# Patient Record
Sex: Female | Born: 1995 | Race: White | Hispanic: No | Marital: Single | Smoking: Never smoker
Health system: Southern US, Community
[De-identification: ages and names within clinical notes are randomized; demographics above are authoritative.]

## PROBLEM LIST (undated history)

## (undated) DIAGNOSIS — R109 Unspecified abdominal pain: Secondary | ICD-10-CM

## (undated) DIAGNOSIS — O09299 Supervision of pregnancy with other poor reproductive or obstetric history, unspecified trimester: Secondary | ICD-10-CM

## (undated) HISTORY — PX: TONSILLECTOMY: SUR1361

## (undated) HISTORY — DX: Unspecified abdominal pain: R10.9

## (undated) HISTORY — DX: Supervision of pregnancy with other poor reproductive or obstetric history, unspecified trimester: O09.299

---
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
## 2017-12-16 DIAGNOSIS — O26893 Other specified pregnancy related conditions, third trimester: Secondary | ICD-10-CM | POA: Diagnosis not present

## 2017-12-16 DIAGNOSIS — Z3A33 33 weeks gestation of pregnancy: Secondary | ICD-10-CM | POA: Diagnosis not present

## 2017-12-16 DIAGNOSIS — R109 Unspecified abdominal pain: Secondary | ICD-10-CM | POA: Diagnosis not present

## 2017-12-28 DIAGNOSIS — Z3685 Encounter for antenatal screening for Streptococcus B: Secondary | ICD-10-CM | POA: Diagnosis not present

## 2018-01-30 DIAGNOSIS — O480 Post-term pregnancy: Secondary | ICD-10-CM | POA: Diagnosis not present

## 2018-01-31 DIAGNOSIS — O99013 Anemia complicating pregnancy, third trimester: Secondary | ICD-10-CM | POA: Diagnosis not present

## 2018-01-31 DIAGNOSIS — Z3A40 40 weeks gestation of pregnancy: Secondary | ICD-10-CM | POA: Diagnosis not present

## 2018-01-31 DIAGNOSIS — O480 Post-term pregnancy: Secondary | ICD-10-CM | POA: Diagnosis not present

## 2018-06-06 NOTE — L&D Delivery Note (Signed)
Delivery Note At 7:17 PM a viable female was delivered via Vaginal, Spontaneous (Presentation: Vertex; LOA).  APGAR: 9, 9; weight see delivery summary.   Placenta status: spontaneous, intact .  Cord: 3 vessels with the following complications: nuchal x 2 reduced as body delivered.   Anesthesia: Epidural Episiotomy: None Lacerations: None Suture Repair: N/A Est. Blood Loss (mL): 400  Mom to postpartum.  Baby to Couplet care / Skin to Skin.  [place], DO 01/18/2019, 8:27 PM

## 2018-06-22 ENCOUNTER — Encounter: Payer: Self-pay | Admitting: Adult Health

## 2018-06-29 ENCOUNTER — Encounter: Payer: Self-pay | Admitting: Adult Health

## 2018-06-29 ENCOUNTER — Ambulatory Visit: Payer: Medicaid Other | Admitting: Adult Health

## 2018-06-29 VITALS — BP 137/83 | HR 103 | Ht 61.0 in | Wt 198.0 lb

## 2018-06-29 DIAGNOSIS — Z3490 Encounter for supervision of normal pregnancy, unspecified, unspecified trimester: Secondary | ICD-10-CM | POA: Insufficient documentation

## 2018-06-29 DIAGNOSIS — O09891 Supervision of other high risk pregnancies, first trimester: Secondary | ICD-10-CM

## 2018-06-29 DIAGNOSIS — O09899 Supervision of other high risk pregnancies, unspecified trimester: Secondary | ICD-10-CM | POA: Insufficient documentation

## 2018-06-29 DIAGNOSIS — Z3201 Encounter for pregnancy test, result positive: Secondary | ICD-10-CM | POA: Diagnosis not present

## 2018-06-29 DIAGNOSIS — O3680X0 Pregnancy with inconclusive fetal viability, not applicable or unspecified: Secondary | ICD-10-CM

## 2018-06-29 LAB — POCT URINE PREGNANCY: Preg Test, Ur: POSITIVE — AB

## 2018-06-29 MED ORDER — PRENATAL VITAMINS 0.8 MG PO TABS: 1.0000 | ORAL_TABLET | Freq: Every day | ORAL | 12 refills | Status: DC

## 2018-06-29 NOTE — Patient Instructions (Signed)
Second Trimester of Pregnancy  The second trimester is from week 14 through week 27 (month 4 through 6). This is often the time in pregnancy that you feel your best. Often times, morning sickness has lessened or quit. You may have more energy, and you may get hungry more often. Your unborn baby is growing rapidly. At the end of the sixth month, he or she is about 9 inches long and weighs about 1 pounds. You will likely feel the baby move between 18 and 20 weeks of pregnancy. Follow these instructions at home: Medicines  Take over-the-counter and prescription medicines only as told by your doctor. Some medicines are safe and some medicines are not safe during pregnancy.  Take a prenatal vitamin that contains at least 600 micrograms (mcg) of folic acid.  If you have trouble pooping (constipation), take medicine that will make your stool soft (stool softener) if your doctor approves. Eating and drinking   Eat regular, healthy meals.  Avoid raw meat and uncooked cheese.  If you get low calcium from the food you eat, talk to your doctor about taking a daily calcium supplement.  Avoid foods that are high in fat and sugars, such as fried and sweet foods.  If you feel sick to your stomach (nauseous) or throw up (vomit): ? Eat 4 or 5 small meals a day instead of 3 large meals. ? Try eating a few soda crackers. ? Drink liquids between meals instead of during meals.  To prevent constipation: ? Eat foods that are high in fiber, like fresh fruits and vegetables, whole grains, and beans. ? Drink enough fluids to keep your pee (urine) clear or pale yellow. Activity  Exercise only as told by your doctor. Stop exercising if you start to have cramps.  Do not exercise if it is too hot, too humid, or if you are in a place of great height (high altitude).  Avoid heavy lifting.  Wear low-heeled shoes. Sit and stand up straight.  You can continue to have sex unless your doctor tells you not to.  Relieving pain and discomfort  Wear a good support bra if your breasts are tender.  Take warm water baths (sitz baths) to soothe pain or discomfort caused by hemorrhoids. Use hemorrhoid cream if your doctor approves.  Rest with your legs raised if you have leg cramps or low back pain.  If you develop puffy, bulging veins (varicose veins) in your legs: ? Wear support hose or compression stockings as told by your doctor. ? Raise (elevate) your feet for 15 minutes, 3-4 times a day. ? Limit salt in your food. Prenatal care  Write down your questions. Take them to your prenatal visits.  Keep all your prenatal visits as told by your doctor. This is important. Safety  Wear your seat belt when driving.  Make a list of emergency phone numbers, including numbers for family, friends, the hospital, and police and fire departments. General instructions  Ask your doctor about the right foods to eat or for help finding a counselor, if you need these services.  Ask your doctor about local prenatal classes. Begin classes before month 6 of your pregnancy.  Do not use hot tubs, steam rooms, or saunas.  Do not douche or use tampons or scented sanitary pads.  Do not cross your legs for long periods of time.  Visit your dentist if you have not done so. Use a soft toothbrush to brush your teeth. Floss gently.  Avoid all smoking, herbs,   and alcohol. Avoid drugs that are not approved by your doctor.  Do not use any products that contain nicotine or tobacco, such as cigarettes and e-cigarettes. If you need help quitting, ask your doctor.  Avoid cat litter boxes and soil used by cats. These carry germs that can cause birth defects in the baby and can cause a loss of your baby (miscarriage) or stillbirth. Contact a doctor if:  You have mild cramps or pressure in your lower belly.  You have pain when you pee (urinate).  You have bad smelling fluid coming from your vagina.  You continue to feel  sick to your stomach (nauseous), throw up (vomit), or have watery poop (diarrhea).  You have a nagging pain in your belly area.  You feel dizzy. Get help right away if:  You have a fever.  You are leaking fluid from your vagina.  You have spotting or bleeding from your vagina.  You have severe belly cramping or pain.  You lose or gain weight rapidly.  You have trouble catching your breath and have chest pain.  You notice sudden or extreme puffiness (swelling) of your face, hands, ankles, feet, or legs.  You have not felt the baby move in over an hour.  You have severe headaches that do not go away when you take medicine.  You have trouble seeing. Summary  The second trimester is from week 14 through week 27 (months 4 through 6). This is often the time in pregnancy that you feel your best.  To take care of yourself and your unborn baby, you will need to eat healthy meals, take medicines only if your doctor tells you to do so, and do activities that are safe for you and your baby.  Call your doctor if you get sick or if you notice anything unusual about your pregnancy. Also, call your doctor if you need help with the right food to eat, or if you want to know what activities are safe for you. This information is not intended to replace advice given to you by your health care provider. Make sure you discuss any questions you have with your health care provider. Document Released: 08/17/2009 Document Revised: 06/28/2016 Document Reviewed: 06/28/2016 Elsevier Interactive Patient Education  2019 [employer] First Trimester of Pregnancy The first trimester of pregnancy is from week 1 until the end of week 13 (months 1 through 3). A week after a sperm fertilizes an egg, the egg will implant on the wall of the uterus. This embryo will begin to develop into a baby. Genes from you and your partner will form the baby. The female genes will determine whether the baby will be a boy or a girl.  At 6-8 weeks, the eyes and face will be formed, and the heartbeat can be seen on ultrasound. At the end of 12 weeks, all the baby's organs will be formed. Now that you are pregnant, you will want to do everything you can to have a healthy baby. Two of the most important things are to get good prenatal care and to follow your health care provider's instructions. Prenatal care is all the medical care you receive before the baby's birth. This care will help prevent, find, and treat any problems during the pregnancy and childbirth. Body changes during your first trimester Your body goes through many changes during pregnancy. The changes vary from woman to woman.  You may gain or lose a couple of pounds at first.  You may feel sick   to your stomach (nauseous) and you may throw up (vomit). If the vomiting is uncontrollable, call your health care provider.  You may tire easily.  You may develop headaches that can be relieved by medicines. All medicines should be approved by your health care provider.  You may urinate more often. Painful urination may mean you have a bladder infection.  You may develop heartburn as a result of your pregnancy.  You may develop constipation because certain hormones are causing the muscles that push stool through your intestines to slow down.  You may develop hemorrhoids or swollen veins (varicose veins).  Your breasts may begin to grow larger and become tender. Your nipples may stick out more, and the tissue that surrounds them (areola) may become darker.  Your gums may bleed and may be sensitive to brushing and flossing.  Dark spots or blotches (chloasma, mask of pregnancy) may develop on your face. This will likely fade after the baby is born.  Your menstrual periods will stop.  You may have a loss of appetite.  You may develop cravings for certain kinds of food.  You may have changes in your emotions from day to day, such as being excited to be pregnant or  being concerned that something may go wrong with the pregnancy and baby.  You may have more vivid and strange dreams.  You may have changes in your hair. These can include thickening of your hair, rapid growth, and changes in texture. Some women also have hair loss during or after pregnancy, or hair that feels dry or thin. Your hair will most likely return to normal after your baby is born. What to expect at prenatal visits During a routine prenatal visit:  You will be weighed to make sure you and the baby are growing normally.  Your blood pressure will be taken.  Your abdomen will be measured to track your baby's growth.  The fetal heartbeat will be listened to between weeks 10 and 14 of your pregnancy.  Test results from any previous visits will be discussed. Your health care provider may ask you:  How you are feeling.  If you are feeling the baby move.  If you have had any abnormal symptoms, such as leaking fluid, bleeding, severe headaches, or abdominal cramping.  If you are using any tobacco products, including cigarettes, chewing tobacco, and electronic cigarettes.  If you have any questions. Other tests that may be performed during your first trimester include:  Blood tests to find your blood type and to check for the presence of any previous infections. The tests will also be used to check for low iron levels (anemia) and protein on red blood cells (Rh antibodies). Depending on your risk factors, or if you previously had diabetes during pregnancy, you may have tests to check for high blood sugar that affects pregnant women (gestational diabetes).  Urine tests to check for infections, diabetes, or protein in the urine.  An ultrasound to confirm the proper growth and development of the baby.  Fetal screens for spinal cord problems (spina bifida) and Down syndrome.  HIV (human immunodeficiency virus) testing. Routine prenatal testing includes screening for HIV, unless you  choose not to have this test.  You may need other tests to make sure you and the baby are doing well. Follow these instructions at home: Medicines  Follow your health care provider's instructions regarding medicine use. Specific medicines may be either safe or unsafe to take during pregnancy.  Take a prenatal vitamin   that contains at least 600 micrograms (mcg) of folic acid.  If you develop constipation, try taking a stool softener if your health care provider approves. Eating and drinking   Eat a balanced diet that includes fresh fruits and vegetables, whole grains, good sources of protein such as meat, eggs, or tofu, and low-fat dairy. Your health care provider will help you determine the amount of weight gain that is right for you.  Avoid raw meat and uncooked cheese. These carry germs that can cause birth defects in the baby.  Eating four or five small meals rather than three large meals a day may help relieve nausea and vomiting. If you start to feel nauseous, eating a few soda crackers can be helpful. Drinking liquids between meals, instead of during meals, also seems to help ease nausea and vomiting.  Limit foods that are high in fat and processed sugars, such as fried and sweet foods.  To prevent constipation: ? Eat foods that are high in fiber, such as fresh fruits and vegetables, whole grains, and beans. ? Drink enough fluid to keep your urine clear or pale yellow. Activity  Exercise only as directed by your health care provider. Most women can continue their usual exercise routine during pregnancy. Try to exercise for 30 minutes at least 5 days a week. Exercising will help you: ? Control your weight. ? Stay in shape. ? Be prepared for labor and delivery.  Experiencing pain or cramping in the lower abdomen or lower back is a good sign that you should stop exercising. Check with your health care provider before continuing with normal exercises.  Try to avoid standing for  long periods of time. Move your legs often if you must stand in one place for a long time.  Avoid heavy lifting.  Wear low-heeled shoes and practice good posture.  You may continue to have sex unless your health care provider tells you not to. Relieving pain and discomfort  Wear a good support bra to relieve breast tenderness.  Take warm sitz baths to soothe any pain or discomfort caused by hemorrhoids. Use hemorrhoid cream if your health care provider approves.  Rest with your legs elevated if you have leg cramps or low back pain.  If you develop varicose veins in your legs, wear support hose. Elevate your feet for 15 minutes, 3-4 times a day. Limit salt in your diet. Prenatal care  Schedule your prenatal visits by the twelfth week of pregnancy. They are usually scheduled monthly at first, then more often in the last 2 months before delivery.  Write down your questions. Take them to your prenatal visits.  Keep all your prenatal visits as told by your health care provider. This is important. Safety  Wear your seat belt at all times when driving.  Make a list of emergency phone numbers, including numbers for family, friends, the hospital, and police and fire departments. General instructions  Ask your health care provider for a referral to a local prenatal education class. Begin classes no later than the beginning of month 6 of your pregnancy.  Ask for help if you have counseling or nutritional needs during pregnancy. Your health care provider can offer advice or refer you to specialists for help with various needs.  Do not use hot tubs, steam rooms, or saunas.  Do not douche or use tampons or scented sanitary pads.  Do not cross your legs for long periods of time.  Avoid cat litter boxes and soil used by cats.   These carry germs that can cause birth defects in the baby and possibly loss of the fetus by miscarriage or stillbirth.  Avoid all smoking, herbs, alcohol, and  medicines not prescribed by your health care provider. Chemicals in these products affect the formation and growth of the baby.  Do not use any products that contain nicotine or tobacco, such as cigarettes and e-cigarettes. If you need help quitting, ask your health care provider. You may receive counseling support and other resources to help you quit.  Schedule a dentist appointment. At home, brush your teeth with a soft toothbrush and be gentle when you floss. Contact a health care provider if:  You have dizziness.  You have mild pelvic cramps, pelvic pressure, or nagging pain in the abdominal area.  You have persistent nausea, vomiting, or diarrhea.  You have a bad smelling vaginal discharge.  You have pain when you urinate.  You notice increased swelling in your face, hands, legs, or ankles.  You are exposed to fifth disease or chickenpox.  You are exposed to German measles (rubella) and have never had it. Get help right away if:  You have a fever.  You are leaking fluid from your vagina.  You have spotting or bleeding from your vagina.  You have severe abdominal cramping or pain.  You have rapid weight gain or loss.  You vomit blood or material that looks like coffee grounds.  You develop a severe headache.  You have shortness of breath.  You have any kind of trauma, such as from a fall or a car accident. Summary  The first trimester of pregnancy is from week 1 until the end of week 13 (months 1 through 3).  Your body goes through many changes during pregnancy. The changes vary from woman to woman.  You will have routine prenatal visits. During those visits, your health care provider will examine you, discuss any test results you may have, and talk with you about how you are feeling. This information is not intended to replace advice given to you by your health care provider. Make sure you discuss any questions you have with your health care provider. Document  Released: 05/17/2001 Document Revised: 05/04/2016 Document Reviewed: 05/04/2016 Elsevier Interactive Patient Education  2019 Elsevier Inc.  

## 2018-06-29 NOTE — Progress Notes (Signed)
Patient ID: Julie Atkins, female   DOB: [birth year], 23 y.o.   MRN: [id number] History of Present Illness:  Julie Atkins is a 23 year old white, engaged in for UPT.Had baby boy 01/31/18.  Had +HPT in December, smells bothered her.  PCP is Dr Hilda Blades in [place].   Current Medications, Allergies, Past Medical History, Past Surgical History, Family History and Social History were reviewed in [employer] record.     Review of Systems: No period since delivery Had +HPT first of December     Physical Exam: BP 137/83 (BP Location: Left Arm, Patient Position: Sitting, Cuff Size: Normal)   Pulse (!) 103   Ht 5\' 1"  (1.549 m)   Wt 198 lb (89.8 kg)   LMP  (LMP Unknown)   Breastfeeding No   BMI 37.41 kg/m   +UPT, will get [place] for dating  General:  Well developed, well nourished, no acute distress Skin:  Warm and dry Neck:  Midline trachea, normal thyroid, good ROM, no lymphadenopathy Lungs; Clear to auscultation bilaterally Cardiovascular: Regular rate and rhythm Abdomen:  Soft, non tender Psych:  No mood changes, alert and cooperative,seems happy Fall risk is low. PHQ 2 score 0.  Impression: 1. Pregnancy test positive   2. Pregnancy, unspecified gestational age   64. Encounter to determine fetal viability of pregnancy, single or unspecified fetus   4. Short interval between pregnancies affecting pregnancy in first trimester, antepartum       Plan: Continue OTC PNV Return in 1 week for dating US/2 weeks new OB  Request records for last pregnancy Review handouts on First trimester and Second trimester and by [facility]

## 2018-07-09 ENCOUNTER — Other Ambulatory Visit: Payer: Medicaid Other

## 2018-07-13 ENCOUNTER — Ambulatory Visit (INDEPENDENT_AMBULATORY_CARE_PROVIDER_SITE_OTHER): Payer: Medicaid Other

## 2018-07-13 ENCOUNTER — Other Ambulatory Visit: Payer: Self-pay | Admitting: Adult Health

## 2018-07-13 ENCOUNTER — Other Ambulatory Visit: Payer: Medicaid Other

## 2018-07-13 DIAGNOSIS — Z3682 Encounter for antenatal screening for nuchal translucency: Secondary | ICD-10-CM

## 2018-07-13 DIAGNOSIS — O3680X0 Pregnancy with inconclusive fetal viability, not applicable or unspecified: Secondary | ICD-10-CM

## 2018-07-13 NOTE — Progress Notes (Signed)
US 13+4 wks,single IUP,positive fht 153 bpm,normal ovaries bilat,posterior placenta gr 0,NB present,NT 2.2 mm,crl 75.67 mm

## 2018-07-15 LAB — INTEGRATED 1
CROWN RUMP LENGTH MAT SCREEN: 78.7 mm
Gest. Age on Collection Date: 13.6 weeks
Maternal Age at EDD: 21.4 yr
NUCHAL TRANSLUCENCY (NT): 2.2 mm
NUMBER OF FETUSES: 1
PAPP-A Value: 1753.8 ng/mL
Weight: 196 [lb_av]

## 2018-08-06 ENCOUNTER — Encounter: Payer: Self-pay | Admitting: Women's Health

## 2018-08-06 ENCOUNTER — Ambulatory Visit: Payer: Self-pay | Admitting: *Deleted

## 2018-08-15 ENCOUNTER — Encounter: Payer: Self-pay | Admitting: Women's Health

## 2018-08-15 ENCOUNTER — Ambulatory Visit: Payer: Self-pay | Admitting: *Deleted

## 2018-08-21 ENCOUNTER — Encounter: Payer: Self-pay | Admitting: Women's Health

## 2018-08-21 ENCOUNTER — Ambulatory Visit: Payer: Self-pay | Admitting: *Deleted

## 2018-09-18 ENCOUNTER — Encounter: Payer: Self-pay | Admitting: Women's Health

## 2018-09-18 ENCOUNTER — Ambulatory Visit: Payer: Self-pay | Admitting: *Deleted

## 2018-09-19 ENCOUNTER — Encounter: Payer: Self-pay | Admitting: Women's Health

## 2018-09-19 ENCOUNTER — Ambulatory Visit: Payer: Medicaid Other | Admitting: *Deleted

## 2018-09-19 ENCOUNTER — Ambulatory Visit (INDEPENDENT_AMBULATORY_CARE_PROVIDER_SITE_OTHER): Payer: Medicaid Other | Admitting: Women's Health

## 2018-09-19 ENCOUNTER — Other Ambulatory Visit: Payer: Self-pay

## 2018-09-19 VITALS — BP 116/72 | HR 89 | Wt 205.0 lb

## 2018-09-19 DIAGNOSIS — Z3482 Encounter for supervision of other normal pregnancy, second trimester: Secondary | ICD-10-CM | POA: Diagnosis not present

## 2018-09-19 DIAGNOSIS — Z23 Encounter for immunization: Secondary | ICD-10-CM

## 2018-09-19 DIAGNOSIS — O09892 Supervision of other high risk pregnancies, second trimester: Secondary | ICD-10-CM

## 2018-09-19 DIAGNOSIS — Z331 Pregnant state, incidental: Secondary | ICD-10-CM

## 2018-09-19 DIAGNOSIS — Z3490 Encounter for supervision of normal pregnancy, unspecified, unspecified trimester: Secondary | ICD-10-CM | POA: Insufficient documentation

## 2018-09-19 DIAGNOSIS — Z3A23 23 weeks gestation of pregnancy: Secondary | ICD-10-CM | POA: Diagnosis not present

## 2018-09-19 DIAGNOSIS — Z363 Encounter for antenatal screening for malformations: Secondary | ICD-10-CM

## 2018-09-19 DIAGNOSIS — Z1389 Encounter for screening for other disorder: Secondary | ICD-10-CM

## 2018-09-19 DIAGNOSIS — O0932 Supervision of pregnancy with insufficient antenatal care, second trimester: Secondary | ICD-10-CM

## 2018-09-19 DIAGNOSIS — Z862 Personal history of diseases of the blood and blood-forming organs and certain disorders involving the immune mechanism: Secondary | ICD-10-CM

## 2018-09-19 DIAGNOSIS — O09899 Supervision of other high risk pregnancies, unspecified trimester: Secondary | ICD-10-CM

## 2018-09-19 DIAGNOSIS — Z8759 Personal history of other complications of pregnancy, childbirth and the puerperium: Secondary | ICD-10-CM

## 2018-09-19 LAB — POCT URINALYSIS DIPSTICK OB
Blood, UA: NEGATIVE
Glucose, UA: NEGATIVE
Ketones, UA: NEGATIVE
Leukocytes, UA: NEGATIVE
Nitrite, UA: NEGATIVE
POC,PROTEIN,UA: NEGATIVE

## 2018-09-19 NOTE — Progress Notes (Signed)
TELEHEALTH VIRTUAL INITIAL OBSTETRICAL VISIT ENCOUNTER NOTE Patient name: Julie Atkins MRN [id number]  Date of birth: Aug 03, 1995  I connected with@ on 09/19/18 at 11:00 AM EDT by [facility] and verified that I am speaking with the correct person using two identifiers. Due to COVID-19 recommendations, pt is not currently in our office.    I discussed the limitations, risks, security and privacy concerns of performing an evaluation and management service by telephone and the availability of in person appointments. I also discussed with the patient that there may be a patient responsible charge related to this service. The patient expressed understanding and agreed to proceed.  Chief Complaint:   Initial Prenatal Visit  History of Present Illness:   Julie Atkins is a 23 y.o. G29P1001 Caucasian female at [redacted]w[redacted]d by 13wk u/s, with an Estimated Date of Delivery: 01/14/19 being evaluated today for her initial obstetrical visit.   Her obstetrical history is significant for PPH w/ 9lb baby @ [facility], got 'suppositories', no tranfusion, no shoulder dystocia she is aware of, no GDM. Today she reports no complaints.  No LMP recorded (lmp unknown). Patient is pregnant. Last pap just turned 23yo. Results were: n/a Review of Systems:   Pertinent items are noted in HPI Denies cramping/contractions, leakage of fluid, vaginal bleeding, abnormal vaginal discharge w/ itching/odor/irritation, headaches, visual changes, shortness of breath, chest pain, abdominal pain, severe nausea/vomiting, or problems with urination or bowel movements unless otherwise stated above.  Pertinent History Reviewed:  Reviewed past medical,surgical, social, obstetrical and family history.  Reviewed problem list, medications and allergies. OB History  Gravida Para Term Preterm AB Living  2 1 1     1   SAB TAB Ectopic Multiple Live Births          1    # Outcome Date GA Lbr Len/2nd Weight Sex Delivery Anes PTL Lv  2 Current            1 Term 01/31/18 [redacted]w[redacted]d  9 lb 1 oz (4.111 kg) M Vag-Spont EPI  LIV   Physical Assessment:   Vitals:   09/19/18 1049  BP: 116/72  Pulse: 89  Weight: 205 lb (93 kg)  Body mass index is 38.73 kg/m.       Physical Examination:  General:  Alert, oriented and cooperative.   Mental Status: Normal mood and affect perceived. Normal judgment and thought content.  Rest of physical exam deferred due to type of encounter  Results for orders placed or performed in visit on 09/19/18 (from the past 24 hour(s))  POC Urinalysis Dipstick OB   Collection Time: 09/19/18 11:09 AM  Result Value Ref Range   Color, UA     Clarity, UA     Glucose, UA Negative Negative   Bilirubin, UA     Ketones, UA neg    Spec Grav, UA     Blood, UA neg    pH, UA     POC,PROTEIN,UA Negative Negative, Trace, Small (1+), Moderate (2+), Large (3+), 4+   Urobilinogen, UA     Nitrite, UA neg    Leukocytes, UA Negative Negative   Appearance     Odor      Assessment & Plan:  1) Low-Risk Pregnancy G2P1001 at [redacted]w[redacted]d with an Estimated Date of Delivery: 01/14/19   2) Initial OB visit  3) H/O LGA  4) H/O PPH-will get delivery note  5) Short interval pregnancy   Meds: No orders of the defined types were placed in this encounter.   Initial  labs obtained Continue prenatal vitamins Reviewed n/v relief measures and warning s/s to report Reviewed recommended weight gain based on pre-gravid BMI Encouraged well-balanced diet Genetic Screening discussed: too late Cystic fibrosis, SMA, Fragile X screening discussed declined Ultrasound discussed; fetal survey: requested CCNC completed Has BP cuff at home Check BP weekly, let us know if >140/90   I discussed the assessment and treatment plan with the patient. The patient was provided an opportunity to ask questions and all were answered. The patient agreed with the plan and demonstrated an understanding of the instructions.   The patient was advised to call back or  seek an in-person evaluation/go to the ED for any concerning postpartum symptoms.  I provided 14 minutes of non-face-to-face time during this encounter.  Follow-up: Return for asap for anatomy u/s, then 4wks for LROB and PN2.   Orders Placed This Encounter  Procedures  . GC/Chlamydia Probe Amp  . Urine Culture  . US OB Comp + 14 Wk  . Flu Vaccine QUAD 36+ mos IM  . Obstetric Panel, Including HIV  . Urinalysis, Routine w reflex microscopic  . Sickle cell screen  . Pain Management Screening Profile (10S)  . POC Urinalysis Dipstick OB    Cheral MarkerKimberly R Lynda Wanninger CNM, [facility]NP-BC 09/19/2018 1:03 PM

## 2018-09-20 LAB — OBSTETRIC PANEL, INCLUDING HIV
Antibody Screen: NEGATIVE
Basophils Absolute: 0.1 10*3/uL (ref 0.0–0.2)
Basos: 1 %
EOS (ABSOLUTE): 0.2 10*3/uL (ref 0.0–0.4)
Eos: 2 %
HIV Screen 4th Generation wRfx: NONREACTIVE
Hematocrit: 32 % — ABNORMAL LOW (ref 34.0–46.6)
Hemoglobin: 11.8 g/dL (ref 11.1–15.9)
Hepatitis B Surface Ag: NEGATIVE
Immature Grans (Abs): 0.1 10*3/uL (ref 0.0–0.1)
Immature Granulocytes: 1 %
Lymphocytes Absolute: 2.5 10*3/uL (ref 0.7–3.1)
Lymphs: 25 %
MCH: 30.4 pg (ref 26.6–33.0)
MCHC: 36.9 g/dL — ABNORMAL HIGH (ref 31.5–35.7)
MCV: 83 fL (ref 79–97)
Monocytes Absolute: 0.9 10*3/uL (ref 0.1–0.9)
Monocytes: 8 %
Neutrophils Absolute: 6.6 10*3/uL (ref 1.4–7.0)
Neutrophils: 63 %
Platelets: 360 10*3/uL (ref 150–450)
RBC: 3.88 x10E6/uL (ref 3.77–5.28)
RDW: 14 % (ref 11.7–15.4)
RPR Ser Ql: NONREACTIVE
Rh Factor: POSITIVE
Rubella Antibodies, IGG: 1.32 index (ref >0.99)
WBC: 10.2 10*3/uL (ref 3.4–10.8)

## 2018-09-20 LAB — URINALYSIS, ROUTINE W REFLEX MICROSCOPIC
Bilirubin, UA: NEGATIVE
Glucose, UA: NEGATIVE
Ketones, UA: NEGATIVE
Nitrite, UA: NEGATIVE
Protein,UA: NEGATIVE
RBC, UA: NEGATIVE
Specific Gravity, UA: 1.017 (ref 1.005–1.030)
Urobilinogen, Ur: 0.2 mg/dL (ref 0.2–1.0)
pH, UA: 8.5 — ABNORMAL HIGH (ref 5.0–7.5)

## 2018-09-20 LAB — MICROSCOPIC EXAMINATION
Casts: NONE SEEN /lpf
Epithelial Cells (non renal): >10 /hpf — AB (ref 0–10)
RBC: NONE SEEN /hpf (ref 0–2)

## 2018-09-20 LAB — PMP SCREEN PROFILE (10S), URINE
Amphetamine Scrn, Ur: NEGATIVE ng/mL
BARBITURATE SCREEN URINE: NEGATIVE ng/mL
BENZODIAZEPINE SCREEN, URINE: NEGATIVE ng/mL
CANNABINOIDS UR QL SCN: NEGATIVE ng/mL
Cocaine (Metab) Scrn, Ur: NEGATIVE ng/mL
Creatinine(Crt), U: 61.6 mg/dL (ref 20.0–300.0)
Methadone Screen, Urine: NEGATIVE ng/mL
OXYCODONE+OXYMORPHONE UR QL SCN: NEGATIVE ng/mL
Opiate Scrn, Ur: NEGATIVE ng/mL
Ph of Urine: 8.3 (ref 4.5–8.9)
Phencyclidine Qn, Ur: NEGATIVE ng/mL
Propoxyphene Scrn, Ur: NEGATIVE ng/mL

## 2018-09-20 LAB — SICKLE CELL SCREEN: Sickle Cell Screen: NEGATIVE

## 2018-09-21 LAB — URINE CULTURE

## 2018-09-22 LAB — GC/CHLAMYDIA PROBE AMP
Chlamydia trachomatis, NAA: NEGATIVE
Neisseria Gonorrhoeae by PCR: NEGATIVE

## 2018-09-26 ENCOUNTER — Ambulatory Visit (INDEPENDENT_AMBULATORY_CARE_PROVIDER_SITE_OTHER): Payer: Medicaid Other

## 2018-09-26 ENCOUNTER — Other Ambulatory Visit: Payer: Self-pay

## 2018-09-26 DIAGNOSIS — Z3482 Encounter for supervision of other normal pregnancy, second trimester: Secondary | ICD-10-CM

## 2018-09-26 DIAGNOSIS — Z363 Encounter for antenatal screening for malformations: Secondary | ICD-10-CM

## 2018-09-26 DIAGNOSIS — O0932 Supervision of pregnancy with insufficient antenatal care, second trimester: Secondary | ICD-10-CM

## 2018-09-26 DIAGNOSIS — Z3A24 24 weeks gestation of pregnancy: Secondary | ICD-10-CM | POA: Diagnosis not present

## 2018-09-26 NOTE — Progress Notes (Signed)
[place] 24+2 wks,breech,cx 4.8 cm,posterior placenta gr 0,LVEICF 1.7 mm,fhr 135 bpm,svp of fluid 4.4 cm,efw 722 g 58%,anatomy complete,no obvious abnormalities

## 2018-10-03 ENCOUNTER — Telehealth: Payer: Self-pay | Admitting: Women's Health

## 2018-10-03 NOTE — Telephone Encounter (Signed)
Has some questions about her 20 week [place] report on her my chart.

## 2018-10-04 ENCOUNTER — Telehealth: Payer: Self-pay | Admitting: Women's Health

## 2018-10-04 DIAGNOSIS — Z1379 Encounter for other screening for genetic and chromosomal anomalies: Secondary | ICD-10-CM

## 2018-10-04 DIAGNOSIS — O283 Abnormal ultrasonic finding on antenatal screening of mother: Secondary | ICD-10-CM

## 2018-10-04 NOTE — Telephone Encounter (Signed)
Called pt, discussed isolated EICF on u/s, offered [id number], would like to do. Coming today.  Cheral Marker, CNM, [facility] 10/04/2018 2:28 PM

## 2018-10-08 LAB — MATERNIT 21 PLUS CORE, BLOOD
Fetal Fraction: 5
Result (T21): NEGATIVE
Trisomy 13 (Patau syndrome): NEGATIVE
Trisomy 18 (Edwards syndrome): NEGATIVE
Trisomy 21 (Down syndrome): NEGATIVE

## 2018-10-16 ENCOUNTER — Encounter: Payer: Self-pay | Admitting: *Deleted

## 2018-10-17 ENCOUNTER — Other Ambulatory Visit: Payer: Medicaid Other

## 2018-10-17 ENCOUNTER — Other Ambulatory Visit: Payer: Self-pay

## 2018-10-17 ENCOUNTER — Ambulatory Visit (INDEPENDENT_AMBULATORY_CARE_PROVIDER_SITE_OTHER): Payer: Medicaid Other | Admitting: Obstetrics and Gynecology

## 2018-10-17 ENCOUNTER — Encounter: Payer: Self-pay | Admitting: Obstetrics and Gynecology

## 2018-10-17 ENCOUNTER — Other Ambulatory Visit (HOSPITAL_COMMUNITY)
Admission: RE | Admit: 2018-10-17 | Discharge: 2018-10-17 | Disposition: A | Discharge status: Home / Self Care | Payer: Medicaid Other | Source: Ambulatory Visit | Attending: Obstetrics and Gynecology | Admitting: Obstetrics and Gynecology

## 2018-10-17 VITALS — BP 109/68 | HR 87 | Temp 97.8°F | Wt 207.4 lb

## 2018-10-17 DIAGNOSIS — Z23 Encounter for immunization: Secondary | ICD-10-CM | POA: Diagnosis not present

## 2018-10-17 DIAGNOSIS — Z3482 Encounter for supervision of other normal pregnancy, second trimester: Secondary | ICD-10-CM | POA: Diagnosis not present

## 2018-10-17 DIAGNOSIS — Z3A27 27 weeks gestation of pregnancy: Secondary | ICD-10-CM | POA: Insufficient documentation

## 2018-10-17 DIAGNOSIS — Z1389 Encounter for screening for other disorder: Secondary | ICD-10-CM

## 2018-10-17 DIAGNOSIS — Z331 Pregnant state, incidental: Secondary | ICD-10-CM

## 2018-10-17 LAB — POCT URINALYSIS DIPSTICK OB
Blood, UA: NEGATIVE
Glucose, UA: NEGATIVE
Ketones, UA: NEGATIVE
Leukocytes, UA: NEGATIVE
Nitrite, UA: NEGATIVE
POC,PROTEIN,UA: NEGATIVE

## 2018-10-17 NOTE — Progress Notes (Signed)
Patient ID: Julie Atkins, female   DOB: [birth year], 23 y.o.   MRN: [id number]    LOW-RISK PREGNANCY VISIT Patient name: Julie Atkins MRN [id number]  Date of birth: Feb 27, 1996 Chief Complaint:   No chief complaint on file.  History of Present Illness:   Julie Atkins is a 23 y.o. G19P1001 female at [redacted]w[redacted]d with an Estimated Date of Delivery: 01/14/19 being seen today for ongoing management of a low-risk pregnancy. Has taken BC pill but never remembered to take it. Is unsure of which method she wants to use.  Lengthy discussion of contraception options.  Patient is given a copy of the birth control guide and seems interested in IUD  today she reports no complaints.  .  .   . denies leaking of fluid. Review of Systems:   Pertinent items are noted in HPI Denies abnormal vaginal discharge w/ itching/odor/irritation, headaches, visual changes, shortness of breath, chest pain, abdominal pain, severe nausea/vomiting, or problems with urination or bowel movements unless otherwise stated above. Pertinent History Reviewed:  Reviewed past medical,surgical, social, obstetrical and family history.  Reviewed problem list, medications and allergies. Physical Assessment:  There were no vitals filed for this visit.There is no height or weight on file to calculate BMI.        Physical Examination:   General appearance: Well appearing, and in no distress  Mental status: Alert, oriented to person, place, and time  Skin: Warm & dry  Cardiovascular: Normal heart rate noted  Respiratory: Normal respiratory effort, no distress  Abdomen: Soft, gravid, nontender  Pelvic: Cervical exam performed PAP w/ gcchl, appears closed normal in appearance well supported.         Extremities:    Fetal Status:        Fetal heart rate 149, fundal height 27 cm unknown presentation  No results found for this or any previous visit (from the past 24 hour(s)).  Assessment & Plan:  1) Low-risk pregnancy G2P1001 at [redacted]w[redacted]d with  an Estimated Date of Delivery: 01/14/19   2) Pap, completed  3) Birth control options discussed probable IUD selection  Reviewed all forms of birth control options available including abstinence; fertility period awareness methods; over the counter/barrier methods; hormonal contraceptive medication including pill, patch, ring, injection,contraceptive implant; hormonal and nonhormonal IUDs; permanent sterilization options including vasectomy and the various tubal sterilization modalities. Risks and benefits reviewed.  Questions were answered.  Information was given to patient to review.     Meds: No orders of the defined types were placed in this encounter.  Labs/procedures today: PN2, PAP  Plan:   1. Continue routine obstetrical care 2. F/u in 4 weeks televisit  Follow-up: No follow-ups on file.  No orders of the defined types were placed in this encounter.  By signing my name below, I, Arnette Norris, attest that this documentation has been prepared under the direction and in the presence of Tilda Burrow, MD. Electronically Signed: Arnette Norris Medical Scribe. 10/17/18. 9:08 AM.  I personally performed the services described in this documentation, which was SCRIBED in my presence. The recorded information has been reviewed and considered accurate. It has been edited as necessary during review. Tilda Burrow, MD

## 2018-10-18 LAB — RPR: RPR Ser Ql: NONREACTIVE

## 2018-10-18 LAB — CYTOLOGY - PAP: Diagnosis: NEGATIVE

## 2018-10-18 LAB — GLUCOSE TOLERANCE, 2 HOURS W/ 1HR
Glucose, 1 hour: 105 mg/dL (ref 65–179)
Glucose, 2 hour: 126 mg/dL (ref 65–152)
Glucose, Fasting: 85 mg/dL (ref 65–91)

## 2018-10-18 LAB — CBC
Hematocrit: 32.1 % — ABNORMAL LOW (ref 34.0–46.6)
Hemoglobin: 10.8 g/dL — ABNORMAL LOW (ref 11.1–15.9)
MCH: 27.5 pg (ref 26.6–33.0)
MCHC: 33.6 g/dL (ref 31.5–35.7)
MCV: 82 fL (ref 79–97)
Platelets: 389 10*3/uL (ref 150–450)
RBC: 3.93 x10E6/uL (ref 3.77–5.28)
RDW: 13.8 % (ref 11.7–15.4)
WBC: 11.9 10*3/uL — ABNORMAL HIGH (ref 3.4–10.8)

## 2018-10-18 LAB — ANTIBODY SCREEN: Antibody Screen: NEGATIVE

## 2018-10-18 LAB — HIV ANTIBODY (ROUTINE TESTING W REFLEX): HIV Screen 4th Generation wRfx: NONREACTIVE

## 2018-11-28 ENCOUNTER — Other Ambulatory Visit: Payer: Self-pay

## 2018-11-28 ENCOUNTER — Ambulatory Visit (INDEPENDENT_AMBULATORY_CARE_PROVIDER_SITE_OTHER): Payer: Medicaid Other | Admitting: Women's Health

## 2018-11-28 ENCOUNTER — Encounter: Payer: Self-pay | Admitting: Women's Health

## 2018-11-28 DIAGNOSIS — Z3483 Encounter for supervision of other normal pregnancy, third trimester: Secondary | ICD-10-CM

## 2018-11-28 DIAGNOSIS — Z3A33 33 weeks gestation of pregnancy: Secondary | ICD-10-CM

## 2018-11-28 NOTE — Progress Notes (Signed)
   TELEHEALTH VIRTUAL OBSTETRICS VISIT ENCOUNTER NOTE Patient name: Julie Atkins MRN [id number]  Date of birth: 1995/12/16  I connected with patient on 11/28/18 at  1:30 PM EDT by [facility] and verified that I am speaking with the correct person using two identifiers. Due to COVID-19 recommendations, pt is not currently in our office.    I discussed the limitations, risks, security and privacy concerns of performing an evaluation and management service by telephone and the availability of in person appointments. I also discussed with the patient that there may be a patient responsible charge related to this service. The patient expressed understanding and agreed to proceed.  Chief Complaint:   Routine Prenatal Visit  History of Present Illness:   Julie Atkins is a 23 y.o. G60P1001 female at [redacted]w[redacted]d with an Estimated Date of Delivery: 01/14/19 being evaluated today for ongoing management of a low-risk pregnancy.  Today she reports no complaints. Contractions: Not present. Vag. Bleeding: None.  Movement: Present. denies leaking of fluid. Review of Systems:   Pertinent items are noted in HPI Denies abnormal vaginal discharge w/ itching/odor/irritation, headaches, visual changes, shortness of breath, chest pain, abdominal pain, severe nausea/vomiting, or problems with urination or bowel movements unless otherwise stated above. Pertinent History Reviewed:  Reviewed past medical,surgical, social, obstetrical and family history.  Reviewed problem list, medications and allergies. Physical Assessment:   Vitals:   11/28/18 1354  BP: 118/67  Pulse: 93  There is no height or weight on file to calculate BMI.        Physical Examination:   General:  Alert, oriented and cooperative.   Mental Status: Normal mood and affect perceived. Normal judgment and thought content.  Rest of physical exam deferred due to type of encounter  No results found for this or any previous visit (from the past 24 hour(s)).   Assessment & Plan:  1) Pregnancy G2P1001 at [redacted]w[redacted]d with an Estimated Date of Delivery: 01/14/19   2) H/O PPH   Meds: No orders of the defined types were placed in this encounter.   Labs/procedures today: none  Plan:  Continue routine obstetrical care.  Has home bp cuff.  Check bp weekly, let us know if >140/90.   Reviewed: Preterm labor symptoms and general obstetric precautions including but not limited to vaginal bleeding, contractions, leaking of fluid and fetal movement were reviewed in detail with the patient. The patient was advised to call back or seek an in-person office evaluation/go to MAU at [facility] for any urgent or concerning symptoms. All questions were answered. Please refer to After Visit Summary for other counseling recommendations.    I provided 10 minutes of non-face-to-face time during this encounter.  Follow-up: Return in about 2 weeks (around 12/12/2018) for [place], [employer].  No orders of the defined types were placed in this encounter.  [place], [facility] 11/28/2018 2:11 PM

## 2018-11-28 NOTE — Patient Instructions (Signed)
Julie Atkins, Julie Atkins greatly value your feedback.  If you receive a survey following your visit with us today, we appreciate you taking the time to fill it out.  Thanks, Julie Atkins, CNM, [facility]NP-BC  [facility] now [facility] ([address] [place], [place] [address]) Entrance located off of [address] Free 24/7 valet parking    Call the office [phone]) or go to [facility] if:  You begin to have strong, frequent contractions  Your water breaks.  Sometimes it is a big gush of fluid, sometimes it is just a trickle that keeps getting your panties wet or running down your legs  You have vaginal bleeding.  It is normal to have a small amount of spotting if your cervix was checked.   You don't feel your baby moving like normal.  If you don't, get you something to eat and drink and lay down and focus on feeling your baby move.  You should feel at least 10 movements in 2 hours.  If you don't, you should call the office or go to [facility].    Tdap Vaccine  It is recommended that you get the Tdap vaccine during the third trimester of EACH pregnancy to help protect your baby from getting pertussis (whooping cough)  27-36 weeks is the BEST time to do this so that you can pass the protection on to your baby. During pregnancy is better than after pregnancy, but if you are unable to get it during pregnancy it will be offered at the hospital.   You can get this vaccine with us, at the health department, your family doctor, or some local pharmacies  Everyone who will be around your baby should also be up-to-date on their vaccines before the baby comes. Adults (who are not pregnant) only need 1 dose of Tdap during adulthood.   Home Blood Pressure Monitoring for Patients   Your provider has recommended that you check your blood pressure (BP) at least once a week at home. If you do not have a blood pressure cuff at home, one will be  provided for you. Contact your provider if you have not received your monitor within 1 week.   Helpful Tips for Accurate Home Blood Pressure Checks  . Don't smoke, exercise, or drink caffeine 30 minutes before checking your BP . Use the restroom before checking your BP (a full bladder can raise your pressure) . Relax in a comfortable upright chair . Feet on the ground . Left arm resting comfortably on a flat surface at the level of your heart . Legs uncrossed . Back supported . Sit quietly and don't talk . Place the cuff on your bare arm . Adjust snuggly, so that only two fingertips can fit between your skin and the top of the cuff . Check 2 readings separated by at least one minute . Keep a log of your BP readings . For a visual, please reference this diagram: http://ccnc.care/bpdiagram  Provider Name: [facility]     Phone: [phone]  Zone 1: ALL CLEAR  Continue to monitor your symptoms:  . BP reading is less than 140 (top number) or less than 90 (bottom number)  . No right upper stomach pain . No headaches or seeing spots . No feeling nauseated or throwing up . No swelling in face and hands  Zone 2: CAUTION Call your doctor's office for any of the following:  . BP reading is  greater than 140 (top number) or greater than 90 (bottom number)  . Stomach pain under your ribs in the middle or right side . Headaches or seeing spots . Feeling nauseated or throwing up . Swelling in face and hands  Zone 3: EMERGENCY  Seek immediate medical care if you have any of the following:  . BP reading is greater than160 (top number) or greater than 110 (bottom number) . Severe headaches not improving with Tylenol . Serious difficulty catching your breath . Any worsening symptoms from Zone 2   Third Trimester of Pregnancy The third trimester is from week 29 through week 42, months 7 through 9. The third trimester is a time when the fetus is growing rapidly. At the end of the ninth  month, the fetus is about 20 inches in length and weighs 6-10 pounds.  BODY CHANGES Your body goes through many changes during pregnancy. The changes vary from woman to woman.   Your weight will continue to increase. You can expect to gain 25-35 pounds (11-16 kg) by the end of the pregnancy.  You may begin to get stretch marks on your hips, abdomen, and breasts.  You may urinate more often because the fetus is moving lower into your pelvis and pressing on your bladder.  You may develop or continue to have heartburn as a result of your pregnancy.  You may develop constipation because certain hormones are causing the muscles that push waste through your intestines to slow down.  You may develop hemorrhoids or swollen, bulging veins (varicose veins).  You may have pelvic pain because of the weight gain and pregnancy hormones relaxing your joints between the bones in your pelvis. Backaches may result from overexertion of the muscles supporting your posture.  You may have changes in your hair. These can include thickening of your hair, rapid growth, and changes in texture. Some women also have hair loss during or after pregnancy, or hair that feels dry or thin. Your hair will most likely return to normal after your baby is born.  Your breasts will continue to grow and be tender. A yellow discharge may leak from your breasts called colostrum.  Your belly button may stick out.  You may feel short of breath because of your expanding uterus.  You may notice the fetus "dropping," or moving lower in your abdomen.  You may have a bloody mucus discharge. This usually occurs a few days to a week before labor begins.  Your cervix becomes thin and soft (effaced) near your due date. WHAT TO EXPECT AT YOUR PRENATAL EXAMS  You will have prenatal exams every 2 weeks until week 36. Then, you will have weekly prenatal exams. During a routine prenatal visit:  You will be weighed to make sure you and the  fetus are growing normally.  Your blood pressure is taken.  Your abdomen will be measured to track your baby's growth.  The fetal heartbeat will be listened to.  Any test results from the previous visit will be discussed.  You may have a cervical check near your due date to see if you have effaced. At around 36 weeks, your caregiver will check your cervix. At the same time, your caregiver will also perform a test on the secretions of the vaginal tissue. This test is to determine if a type of bacteria, Group B streptococcus, is present. Your caregiver will explain this further. Your caregiver may ask you:  What your birth plan is.  How you are feeling.  If you are feeling the baby move.  If you have had any abnormal symptoms, such as leaking fluid, bleeding, severe headaches, or abdominal cramping.  If you have any questions. Other tests or screenings that may be performed during your third trimester include:  Blood tests that check for low iron levels (anemia).  Fetal testing to check the health, activity level, and growth of the fetus. Testing is done if you have certain medical conditions or if there are problems during the pregnancy. FALSE LABOR You may feel small, irregular contractions that eventually go away. These are called Braxton Hicks contractions, or false labor. Contractions may last for hours, days, or even weeks before true labor sets in. If contractions come at regular intervals, intensify, or become painful, it is best to be seen by your caregiver.  SIGNS OF LABOR   Menstrual-like cramps.  Contractions that are 5 minutes apart or less.  Contractions that start on the top of the uterus and spread down to the lower abdomen and back.  A sense of increased pelvic pressure or back pain.  A watery or bloody mucus discharge that comes from the vagina. If you have any of these signs before the 37th week of pregnancy, call your caregiver right away. You need to go to  the hospital to get checked immediately. HOME CARE INSTRUCTIONS   Avoid all smoking, herbs, alcohol, and unprescribed drugs. These chemicals affect the formation and growth of the baby.  Follow your caregiver's instructions regarding medicine use. There are medicines that are either safe or unsafe to take during pregnancy.  Exercise only as directed by your caregiver. Experiencing uterine cramps is a good sign to stop exercising.  Continue to eat regular, healthy meals.  Wear a good support bra for breast tenderness.  Do not use hot tubs, steam rooms, or saunas.  Wear your seat belt at all times when driving.  Avoid raw meat, uncooked cheese, cat litter boxes, and soil used by cats. These carry germs that can cause birth defects in the baby.  Take your prenatal vitamins.  Try taking a stool softener (if your caregiver approves) if you develop constipation. Eat more high-fiber foods, such as fresh vegetables or fruit and whole grains. Drink plenty of fluids to keep your urine clear or pale yellow.  Take warm sitz baths to soothe any pain or discomfort caused by hemorrhoids. Use hemorrhoid cream if your caregiver approves.  If you develop varicose veins, wear support hose. Elevate your feet for 15 minutes, 3-4 times a day. Limit salt in your diet.  Avoid heavy lifting, wear low heal shoes, and practice good posture.  Rest a lot with your legs elevated if you have leg cramps or low back pain.  Visit your dentist if you have not gone during your pregnancy. Use a soft toothbrush to brush your teeth and be gentle when you floss.  A sexual relationship may be continued unless your caregiver directs you otherwise.  Do not travel far distances unless it is absolutely necessary and only with the approval of your caregiver.  Take prenatal classes to understand, practice, and ask questions about the labor and delivery.  Make a trial run to the hospital.  Pack your hospital  bag.  Prepare the baby's nursery.  Continue to go to all your prenatal visits as directed by your caregiver. SEEK MEDICAL CARE IF:  You are unsure if you are in labor or if your water has broken.  You have dizziness.  You have  mild pelvic cramps, pelvic pressure, or nagging pain in your abdominal area.  You have persistent nausea, vomiting, or diarrhea.  You have a bad smelling vaginal discharge.  You have pain with urination. SEEK IMMEDIATE MEDICAL CARE IF:   You have a fever.  You are leaking fluid from your vagina.  You have spotting or bleeding from your vagina.  You have severe abdominal cramping or pain.  You have rapid weight loss or gain.  You have shortness of breath with chest pain.  You notice sudden or extreme swelling of your face, hands, ankles, feet, or legs.  You have not felt your baby move in over an hour.  You have severe headaches that do not go away with medicine.  You have vision changes. Document Released: 05/17/2001 Document Revised: 05/28/2013 Document Reviewed: 07/24/2012 [facility] Patient Information 2015 [employer] [place]. This information is not intended to replace advice given to you by your health care provider. Make sure you discuss any questions you have with your health care provider.

## 2018-12-12 ENCOUNTER — Other Ambulatory Visit: Payer: Self-pay

## 2018-12-12 ENCOUNTER — Encounter: Payer: Self-pay | Admitting: Women's Health

## 2018-12-12 ENCOUNTER — Ambulatory Visit (INDEPENDENT_AMBULATORY_CARE_PROVIDER_SITE_OTHER): Payer: Medicaid Other | Admitting: Women's Health

## 2018-12-12 VITALS — BP 108/68 | HR 101

## 2018-12-12 DIAGNOSIS — Z3A35 35 weeks gestation of pregnancy: Secondary | ICD-10-CM

## 2018-12-12 DIAGNOSIS — Z3483 Encounter for supervision of other normal pregnancy, third trimester: Secondary | ICD-10-CM

## 2018-12-12 NOTE — Progress Notes (Signed)
   TELEHEALTH VIRTUAL OBSTETRICS VISIT ENCOUNTER NOTE Patient name: Julie Atkins MRN [id number]  Date of birth: 1995-08-22  I connected with patient on 12/12/18 at  2:30 PM EDT by [facility] and verified that I am speaking with the correct person using two identifiers. Due to COVID-19 recommendations, pt is not currently in our office.    I discussed the limitations, risks, security and privacy concerns of performing an evaluation and management service by telephone and the availability of in person appointments. I also discussed with the patient that there may be a patient responsible charge related to this service. The patient expressed understanding and agreed to proceed.  Chief Complaint:   Routine Prenatal Visit  History of Present Illness:   Julie Atkins is a 23 y.o. G32P1001 female at [redacted]w[redacted]d with an Estimated Date of Delivery: 01/14/19 being evaluated today for ongoing management of a low-risk pregnancy.  Today she reports no complaints. Contractions: Not present. Vag. Bleeding: None.  Movement: Present. denies leaking of fluid. Review of Systems:   Pertinent items are noted in HPI Denies abnormal vaginal discharge w/ itching/odor/irritation, headaches, visual changes, shortness of breath, chest pain, abdominal pain, severe nausea/vomiting, or problems with urination or bowel movements unless otherwise stated above. Pertinent History Reviewed:  Reviewed past medical,surgical, social, obstetrical and family history.  Reviewed problem list, medications and allergies. Physical Assessment:   Vitals:   12/12/18 1450  BP: 108/68  Pulse: (!) 101  There is no height or weight on file to calculate BMI.        Physical Examination:   General:  Alert, oriented and cooperative.   Mental Status: Normal mood and affect perceived. Normal judgment and thought content.  Rest of physical exam deferred due to type of encounter  No results found for this or any previous visit (from the past 24  hour(s)).  Assessment & Plan:  1) Pregnancy G2P1001 at [redacted]w[redacted]d with an Estimated Date of Delivery: 01/14/19   2) H/O LGA w/ PPH   Meds: No orders of the defined types were placed in this encounter.   Labs/procedures today: none  Plan:  Continue routine obstetrical care.  Has home bp cuff.  Check bp weekly, let us know if >140/90.   Reviewed: Preterm labor symptoms and general obstetric precautions including but not limited to vaginal bleeding, contractions, leaking of fluid and fetal movement were reviewed in detail with the patient. The patient was advised to call back or seek an in-person office evaluation/go to [facility]U at [facility] for any urgent or concerning symptoms. All questions were answered. Please refer to After Visit Summary for other counseling recommendations.    I provided 10 minutes of non-face-to-face time during this encounter.  Follow-up: Return in about 1 week (around 12/19/2018) for [place], in person (gbs).  No orders of the defined types were placed in this encounter.  [place], [facility] 12/12/2018 3:09 PM

## 2018-12-12 NOTE — Patient Instructions (Signed)
Jeb Leveringanielle Dragan, I greatly value your feedback.  If you receive a survey following your visit with us today, we appreciate you taking the time to fill it out.  Thanks, Joellyn HaffKim Booker, CNM, [facility]  [facility] now [facility] ([address] [place], [place] [address]) Entrance located off of [address] Free 24/7 valet parking    Call the office [phone]) or go to [facility] if:  You begin to have strong, frequent contractions  Your water breaks.  Sometimes it is a big gush of fluid, sometimes it is just a trickle that keeps getting your panties wet or running down your legs  You have vaginal bleeding.  It is normal to have a small amount of spotting if your cervix was checked.   You don't feel your baby moving like normal.  If you don't, get you something to eat and drink and lay down and focus on feeling your baby move.  You should feel at least 10 movements in 2 hours.  If you don't, you should call the office or go to [facility].   Home Blood Pressure Monitoring for Patients   Your provider has recommended that you check your blood pressure (BP) at least once a week at home. If you do not have a blood pressure cuff at home, one will be provided for you. Contact your provider if you have not received your monitor within 1 week.   Helpful Tips for Accurate Home Blood Pressure Checks  . Don't smoke, exercise, or drink caffeine 30 minutes before checking your BP . Use the restroom before checking your BP (a full bladder can raise your pressure) . Relax in a comfortable upright chair . Feet on the ground . Left arm resting comfortably on a flat surface at the level of your heart . Legs uncrossed . Back supported . Sit quietly and don't talk . Place the cuff on your bare arm . Adjust snuggly, so that only two fingertips can fit between your skin and the top of the cuff . Check 2 readings separated by at least one minute  . Keep a log of your BP readings . For a visual, please reference this diagram: http://ccnc.care/bpdiagram  Provider Name: [facility]     Phone: [phone]  Zone 1: ALL CLEAR  Continue to monitor your symptoms:  . BP reading is less than 140 (top number) or less than 90 (bottom number)  . No right upper stomach pain . No headaches or seeing spots . No feeling nauseated or throwing up . No swelling in face and hands  Zone 2: CAUTION Call your doctor's office for any of the following:  . BP reading is greater than 140 (top number) or greater than 90 (bottom number)  . Stomach pain under your ribs in the middle or right side . Headaches or seeing spots . Feeling nauseated or throwing up . Swelling in face and hands  Zone 3: EMERGENCY  Seek immediate medical care if you have any of the following:  . BP reading is greater than160 (top number) or greater than 110 (bottom number) . Severe headaches not improving with Tylenol . Serious difficulty catching your breath . Any worsening symptoms from Zone 2    Preterm Labor and Birth Information  The normal length of a pregnancy is 39-41 weeks. Preterm labor is when labor starts before 37 completed weeks of pregnancy. What are the risk factors for preterm  labor? Preterm labor is more likely to occur in women who:  Have certain infections during pregnancy such as a bladder infection, sexually transmitted infection, or infection inside the uterus (chorioamnionitis).  Have a shorter-than-normal cervix.  Have gone into preterm labor before.  Have had surgery on their cervix.  Are younger than age 42 or older than age 10.  Are African American.  Are pregnant with twins or multiple babies (multiple gestation).  Take street drugs or smoke while pregnant.  Do not gain enough weight while pregnant.  Became pregnant shortly after having been pregnant. What are the symptoms of preterm labor? Symptoms of preterm labor  include:  Cramps similar to those that can happen during a menstrual period. The cramps may happen with diarrhea.  Pain in the abdomen or lower back.  Regular uterine contractions that may feel like tightening of the abdomen.  A feeling of increased pressure in the pelvis.  Increased watery or bloody mucus discharge from the vagina.  Water breaking (ruptured amniotic sac). Why is it important to recognize signs of preterm labor? It is important to recognize signs of preterm labor because babies who are born prematurely may not be fully developed. This can put them at an increased risk for:  Long-term (chronic) heart and lung problems.  Difficulty immediately after birth with regulating body systems, including blood sugar, body temperature, heart rate, and breathing rate.  Bleeding in the brain.  Cerebral palsy.  Learning difficulties.  Death. These risks are highest for babies who are born before 98 weeks of pregnancy. How is preterm labor treated? Treatment depends on the length of your pregnancy, your condition, and the health of your baby. It may involve:  Having a stitch (suture) placed in your cervix to prevent your cervix from opening too early (cerclage).  Taking or being given medicines, such as: ? Hormone medicines. These may be given early in pregnancy to help support the pregnancy. ? Medicine to stop contractions. ? Medicines to help mature the baby's lungs. These may be prescribed if the risk of delivery is high. ? Medicines to prevent your baby from developing cerebral palsy. If the labor happens before 34 weeks of pregnancy, you may need to stay in the hospital. What should I do if I think I am in preterm labor? If you think that you are going into preterm labor, call your health care provider right away. How can I prevent preterm labor in future pregnancies? To increase your chance of having a full-term pregnancy:  Do not use any tobacco products, such as  cigarettes, chewing tobacco, and e-cigarettes. If you need help quitting, ask your health care provider.  Do not use street drugs or medicines that have not been prescribed to you during your pregnancy.  Talk with your health care provider before taking any herbal supplements, even if you have been taking them regularly.  Make sure you gain a healthy amount of weight during your pregnancy.  Watch for infection. If you think that you might have an infection, get it checked right away.  Make sure to tell your health care provider if you have gone into preterm labor before. This information is not intended to replace advice given to you by your health care provider. Make sure you discuss any questions you have with your health care provider. Document Released: 08/13/2003 Document Revised: 09/14/2018 Document Reviewed: 10/14/2015 Elsevier Patient Education  2020 [employer].

## 2018-12-20 ENCOUNTER — Other Ambulatory Visit: Payer: Self-pay

## 2018-12-20 ENCOUNTER — Encounter: Payer: Self-pay | Admitting: Obstetrics & Gynecology

## 2018-12-20 ENCOUNTER — Ambulatory Visit (INDEPENDENT_AMBULATORY_CARE_PROVIDER_SITE_OTHER): Payer: Medicaid Other | Admitting: Obstetrics & Gynecology

## 2018-12-20 VITALS — BP 112/70 | HR 105 | Wt 212.0 lb

## 2018-12-20 DIAGNOSIS — Z3483 Encounter for supervision of other normal pregnancy, third trimester: Secondary | ICD-10-CM

## 2018-12-20 DIAGNOSIS — Z3A36 36 weeks gestation of pregnancy: Secondary | ICD-10-CM | POA: Diagnosis not present

## 2018-12-20 DIAGNOSIS — O99013 Anemia complicating pregnancy, third trimester: Secondary | ICD-10-CM

## 2018-12-20 DIAGNOSIS — Z1389 Encounter for screening for other disorder: Secondary | ICD-10-CM

## 2018-12-20 DIAGNOSIS — Z331 Pregnant state, incidental: Secondary | ICD-10-CM

## 2018-12-20 LAB — POCT URINALYSIS DIPSTICK OB
Blood, UA: NEGATIVE
Glucose, UA: NEGATIVE
Ketones, UA: NEGATIVE
Nitrite, UA: NEGATIVE
POC,PROTEIN,UA: NEGATIVE

## 2018-12-20 LAB — POCT HEMOGLOBIN: Hemoglobin: 9.9 g/dL — AB (ref 11–14.6)

## 2018-12-20 NOTE — Progress Notes (Signed)
   LOW-RISK PREGNANCY VISIT Patient name: Julie Atkins MRN [id number]  Date of birth: [birth year] Chief Complaint:   Routine Prenatal Visit (gbs-gc-chl)  History of Present Illness:   Julie Atkins is a 23 y.o. G32P1001 female at [redacted]w[redacted]d with an Estimated Date of Delivery: 01/14/19 being seen today for ongoing management of a low-risk pregnancy.  Today she reports no complaints. Contractions: Not present.  .  Movement: Present. denies leaking of fluid. Review of Systems:   Pertinent items are noted in HPI Denies abnormal vaginal discharge w/ itching/odor/irritation, headaches, visual changes, shortness of breath, chest pain, abdominal pain, severe nausea/vomiting, or problems with urination or bowel movements unless otherwise stated above. Pertinent History Reviewed:  Reviewed past medical,surgical, social, obstetrical and family history.  Reviewed problem list, medications and allergies. Physical Assessment:   Vitals:   12/20/18 1542  BP: 112/70  Pulse: (!) 105  Weight: 212 lb (96.2 kg)  Body mass index is 40.06 kg/m.        Physical Examination:   General appearance: Well appearing, and in no distress  Mental status: Alert, oriented to person, place, and time  Skin: Warm & dry  Cardiovascular: Normal heart rate noted  Respiratory: Normal respiratory effort, no distress  Abdomen: Soft, gravid, nontender  Pelvic: Cervical exam performed  Dilation: Closed Effacement (%): Thick Station: -3  Extremities: Edema: None  Fetal Status: Fetal Heart Rate (bpm): 36 Fundal Height: 140 cm Movement: Present Presentation: Vertex  Results for orders placed or performed in visit on 12/20/18 (from the past 24 hour(s))  POC Urinalysis Dipstick OB   Collection Time: 12/20/18  3:52 PM  Result Value Ref Range   Color, UA     Clarity, UA     Glucose, UA Negative Negative   Bilirubin, UA     Ketones, UA neg    Spec Grav, UA     Blood, UA neg    pH, UA     POC,PROTEIN,UA Negative Negative,  Trace, Small (1+), Moderate (2+), Large (3+), 4+   Urobilinogen, UA     Nitrite, UA neg    Leukocytes, UA Small (1+) (A) Negative   Appearance     Odor    POCT hemoglobin   Collection Time: 12/20/18  4:10 PM  Result Value Ref Range   Hemoglobin 9.9 (A) 11 - 14.6 g/dL    Assessment & Plan:  1) Low-risk pregnancy G2P1001 at [redacted]w[redacted]d with an Estimated Date of Delivery: 01/14/19      Meds: No orders of the defined types were placed in this encounter.  Labs/procedures today: vaginal cultures done  Plan:  Continue routine obstetrical care   Reviewed: Term labor symptoms and general obstetric precautions including but not limited to vaginal bleeding, contractions, leaking of fluid and fetal movement were reviewed in detail with the patient.  All questions were answered. has home bp cuff.  let us know if >140/90.   Follow-up: Return in about 1 week (around 12/27/2018) for [place].  Orders Placed This Encounter  Procedures  . Strep Gp B NAA  . GC/Chlamydia Probe Amp  . POC Urinalysis Dipstick OB  . POCT hemoglobin   Florian Buff  12/20/2018 4:17 PM

## 2018-12-22 LAB — STREP GP B NAA: Strep Gp B NAA: NEGATIVE

## 2018-12-22 LAB — SPECIMEN STATUS REPORT

## 2018-12-26 ENCOUNTER — Ambulatory Visit (INDEPENDENT_AMBULATORY_CARE_PROVIDER_SITE_OTHER): Payer: Medicaid Other | Admitting: Women's Health

## 2018-12-26 ENCOUNTER — Other Ambulatory Visit: Payer: Self-pay

## 2018-12-26 ENCOUNTER — Encounter: Payer: Self-pay | Admitting: Women's Health

## 2018-12-26 VITALS — BP 106/65 | HR 105

## 2018-12-26 DIAGNOSIS — Z3483 Encounter for supervision of other normal pregnancy, third trimester: Secondary | ICD-10-CM

## 2018-12-26 DIAGNOSIS — Z3A37 37 weeks gestation of pregnancy: Secondary | ICD-10-CM

## 2018-12-26 LAB — GC/CHLAMYDIA PROBE AMP
Chlamydia trachomatis, NAA: NEGATIVE
Neisseria Gonorrhoeae by PCR: NEGATIVE

## 2018-12-26 LAB — SPECIMEN STATUS REPORT

## 2018-12-26 NOTE — Patient Instructions (Signed)
Julie Atkins, I greatly value your feedback.  If you receive a survey following your visit with Julie Atkins today, we appreciate you taking the time to fill it out.  Thanks, Joellyn HaffKim Stephine Langbehn, CNM, [facility]NP-BC  [facility] now [facility] ([address] [place], [place] [address]) Entrance located off of [address] Free 24/7 valet parking   Go to [email or web address] to register for FREE online childbirth classes    Call the office [phone]) or go to [facility] if:  You begin to have strong, frequent contractions  Your water breaks.  Sometimes it is a big gush of fluid, sometimes it is just a trickle that keeps getting your panties wet or running down your legs  You have vaginal bleeding.  It is normal to have a small amount of spotting if your cervix was checked.   You don't feel your baby moving like normal.  If you don't, get you something to eat and drink and lay down and focus on feeling your baby move.  You should feel at least 10 movements in 2 hours.  If you don't, you should call the office or go to [facility].   Home Blood Pressure Monitoring for Patients   Your provider has recommended that you check your blood pressure (BP) at least once a week at home. If you do not have a blood pressure cuff at home, one will be provided for you. Contact your provider if you have not received your monitor within 1 week.   Helpful Tips for Accurate Home Blood Pressure Checks   Don't smoke, exercise, or drink caffeine 30 minutes before checking your BP  Use the restroom before checking your BP (a full bladder can raise your pressure)  Relax in a comfortable upright chair  Feet on the ground  Left arm resting comfortably on a flat surface at the level of your heart  Legs uncrossed  Back supported  Sit quietly and don't talk  Place the cuff on your bare arm  Adjust snuggly, so that only two fingertips can fit between your skin  and the top of the cuff  Check 2 readings separated by at least one minute  Keep a log of your BP readings  For a visual, please reference this diagram: http://ccnc.care/bpdiagram  Provider Name: [facility]     Phone: [phone]  Zone 1: ALL CLEAR  Continue to monitor your symptoms:   BP reading is less than 140 (top number) or less than 90 (bottom number)   No right upper stomach pain  No headaches or seeing spots  No feeling nauseated or throwing up  No swelling in face and hands  Zone 2: CAUTION Call your doctor's office for any of the following:   BP reading is greater than 140 (top number) or greater than 90 (bottom number)   Stomach pain under your ribs in the middle or right side  Headaches or seeing spots  Feeling nauseated or throwing up  Swelling in face and hands  Zone 3: EMERGENCY  Seek immediate medical care if you have any of the following:   BP reading is greater than160 (top number) or greater than 110 (bottom number)  Severe headaches not improving with Tylenol  Serious difficulty catching your breath  Any worsening symptoms from Zone 2    Braxton Hicks Contractions Contractions of the uterus can occur throughout pregnancy, but they are not always a sign that you are in  labor. You may have practice contractions called Braxton Hicks contractions. These false labor contractions are sometimes confused with true labor. What are [place] contractions? Braxton Hicks contractions are tightening movements that occur in the muscles of the uterus before labor. Unlike true labor contractions, these contractions do not result in opening (dilation) and thinning of the cervix. Toward the end of pregnancy (32-34 weeks), Braxton Hicks contractions can happen more often and may become stronger. These contractions are sometimes difficult to tell apart from true labor because they can be very uncomfortable. You should not feel embarrassed if you go to  the hospital with false labor. Sometimes, the only way to tell if you are in true labor is for your health care provider to look for changes in the cervix. The health care provider will do a physical exam and may monitor your contractions. If you are not in true labor, the exam should show that your cervix is not dilating and your water has not broken. If there are no other health problems associated with your pregnancy, it is completely safe for you to be sent home with false labor. You may continue to have Braxton Hicks contractions until you go into true labor. How to tell the difference between true labor and false labor True labor  Contractions last 30-70 seconds.  Contractions become very regular.  Discomfort is usually felt in the top of the uterus, and it spreads to the lower abdomen and low back.  Contractions do not go away with walking.  Contractions usually become more intense and increase in frequency.  The cervix dilates and gets thinner. False labor  Contractions are usually shorter and not as strong as true labor contractions.  Contractions are usually irregular.  Contractions are often felt in the front of the lower abdomen and in the groin.  Contractions may go away when you walk around or change positions while lying down.  Contractions get weaker and are shorter-lasting as time goes on.  The cervix usually does not dilate or become thin. Follow these instructions at home:   Take over-the-counter and prescription medicines only as told by your health care provider.  Keep up with your usual exercises and follow other instructions from your health care provider.  Eat and drink lightly if you think you are going into labor.  If Braxton Hicks contractions are making you uncomfortable: ? Change your position from lying down or resting to walking, or change from walking to resting. ? Sit and rest in a tub of warm water. ? Drink enough fluid to keep your urine  pale yellow. Dehydration may cause these contractions. ? Do slow and deep breathing several times an hour.  Keep all follow-up prenatal visits as told by your health care provider. This is important. Contact a health care provider if:  You have a fever.  You have continuous pain in your abdomen. Get help right away if:  Your contractions become stronger, more regular, and closer together.  You have fluid leaking or gushing from your vagina.  You pass blood-tinged mucus (bloody show).  You have bleeding from your vagina.  You have low back pain that you never had before.  You feel your babys head pushing down and causing pelvic pressure.  Your baby is not moving inside you as much as it used to. Summary  Contractions that occur before labor are called Braxton Hicks contractions, false labor, or practice contractions.  Braxton Hicks contractions are usually shorter, weaker, farther apart, and less  regular than true labor contractions. True labor contractions usually become progressively stronger and regular, and they become more frequent.  Manage discomfort from [facility] contractions by changing position, resting in a warm bath, drinking plenty of water, or practicing deep breathing. This information is not intended to replace advice given to you by your health care provider. Make sure you discuss any questions you have with your health care provider. Document Released: 10/06/2016 Document Revised: 05/05/2017 Document Reviewed: 10/06/2016 Elsevier Patient Education  2020 [employer].

## 2018-12-26 NOTE — Progress Notes (Signed)
   TELEHEALTH VIRTUAL OBSTETRICS VISIT ENCOUNTER NOTE Patient name: Julie Atkins MRN [id number]  Date of birth: 1996/05/15  I connected with patient on 12/26/18 at  3:30 PM EDT by [facility] and verified that I am speaking with the correct person using two identifiers. Due to COVID-19 recommendations, pt is not currently in our office.    I discussed the limitations, risks, security and privacy concerns of performing an evaluation and management service by telephone and the availability of in person appointments. I also discussed with the patient that there may be a patient responsible charge related to this service. The patient expressed understanding and agreed to proceed.  Chief Complaint:   Routine Prenatal Visit  History of Present Illness:   Julie Atkins is a 23 y.o. G16P1001 female at [redacted]w[redacted]d with an Estimated Date of Delivery: 01/14/19 being evaluated today for ongoing management of a low-risk pregnancy.  Today she reports no complaints. Contractions: Not present. Vag. Bleeding: None.  Movement: Present. denies leaking of fluid. Review of Systems:   Pertinent items are noted in HPI Denies abnormal vaginal discharge w/ itching/odor/irritation, headaches, visual changes, shortness of breath, chest pain, abdominal pain, severe nausea/vomiting, or problems with urination or bowel movements unless otherwise stated above. Pertinent History Reviewed:  Reviewed past medical,surgical, social, obstetrical and family history.  Reviewed problem list, medications and allergies. Physical Assessment:   Vitals:   12/26/18 1424  BP: 106/65  Pulse: (!) 105  There is no height or weight on file to calculate BMI.        Physical Examination:   General:  Alert, oriented and cooperative.   Mental Status: Normal mood and affect perceived. Normal judgment and thought content.  Rest of physical exam deferred due to type of encounter  No results found for this or any previous visit (from the past 24  hour(s)).  Assessment & Plan:  1) Pregnancy G2P1001 at [redacted]w[redacted]d with an Estimated Date of Delivery: 01/14/19   Meds: No orders of the defined types were placed in this encounter.  Labs/procedures today: none  Plan:  Continue routine obstetrical care.  Has home bp cuff.  Check bp weekly, let us know if >140/90.   Reviewed: Preterm labor symptoms and general obstetric precautions including but not limited to vaginal bleeding, contractions, leaking of fluid and fetal movement were reviewed in detail with the patient. The patient was advised to call back or seek an in-person office evaluation/go to [facility] at [facility] for any urgent or concerning symptoms. All questions were answered. Please refer to After Visit Summary for other counseling recommendations.    I provided 10 minutes of non-face-to-face time during this encounter.  Follow-up: Return in about 1 week (around 01/02/2019) for [place], [place].  No orders of the defined types were placed in this encounter.  [place], [facility] 12/26/2018 2:34 PM

## 2019-01-02 ENCOUNTER — Other Ambulatory Visit: Payer: Self-pay

## 2019-01-02 ENCOUNTER — Encounter: Payer: Self-pay | Admitting: Obstetrics and Gynecology

## 2019-01-02 ENCOUNTER — Ambulatory Visit (INDEPENDENT_AMBULATORY_CARE_PROVIDER_SITE_OTHER): Payer: Medicaid Other | Admitting: Obstetrics and Gynecology

## 2019-01-02 VITALS — BP 110/65 | HR 100

## 2019-01-02 DIAGNOSIS — Z3483 Encounter for supervision of other normal pregnancy, third trimester: Secondary | ICD-10-CM

## 2019-01-02 DIAGNOSIS — Z3A38 38 weeks gestation of pregnancy: Secondary | ICD-10-CM

## 2019-01-02 NOTE — Progress Notes (Signed)
Patient ID: Julie Atkins, female   DOB: 09-03-1995, 23 y.o.   MRN: [id number]    [place] VIRTUAL VIDEO VISIT ENCOUNTER NOTE  Provider location: [facility] [place] [facility]   I connected with Julie Atkins on 01/02/2019 at  3:30 PM EDT by Web[employer] Video Encounter at home and verified that I am speaking with the correct person using two identifiers.   I discussed the limitations, risks, security and privacy concerns of performing an evaluation and management service virtually and the availability of in person appointments. I also discussed with the patient that there may be a patient responsible charge related to this service. The patient expressed understanding and agreed to proceed. Subjective:  Julie Atkins is a 23 y.o. G2P1001 at [redacted]w[redacted]d being seen today for ongoing prenatal care.  She is currently monitored for the following issues for this low-risk pregnancy and has Generalized abdominal pain; Simple constipation; Intestinal bacterial overgrowth; Short interval between pregnancies affecting pregnancy, antepartum; Supervision of normal pregnancy; Late prenatal care in second trimester; and History of postpartum hemorrhage on their problem list.  Patient reports no complaints.  Contractions: Irregular.  .  Movement: Present. Denies any leaking of fluid.   The following portions of the patient's history were reviewed and updated as appropriate: allergies, current medications, past family history, past medical history, past social history, past surgical history and problem list.   Objective:   Vitals:   01/02/19 1543  BP: 110/65  Pulse: 100    Fetal Status:     Movement: Present     General:  Alert, oriented and cooperative. Patient is in no acute distress.  Respiratory: Normal respiratory effort, no problems with respiration noted  Mental Status: Normal mood and affect. Normal behavior. Normal judgment and thought content.  Rest of physical  exam deferred due to type of encounter  Imaging: No results found.  Assessment and Plan:  Pregnancy: G2P1001 at [redacted]w[redacted]d  Term labor symptoms and general obstetric precautions including but not limited to vaginal bleeding, contractions, leaking of fluid and fetal movement were reviewed in detail with the patient. I discussed the assessment and treatment plan with the patient. The patient was provided an opportunity to ask questions and all were answered. The patient agreed with the plan and demonstrated an understanding of the instructions. The patient was advised to call back or seek an in-person office evaluation/go to MAU at [facility] for any urgent or concerning symptoms. Please refer to After Visit Summary for other counseling recommendations.   I provided 8 minutes of face-to-face time during this encounter.  Return in about 1 week (around 01/09/2019).    By signing my name below, I, Samul Dada, attest that this documentation has been prepared under the direction and in the presence of Jonnie Kind, MD. Electronically Signed: Nina. 01/02/19. 3:49 PM.  I personally performed the services described in this documentation, which was SCRIBED in my presence. The recorded information has been reviewed and considered accurate. It has been edited as necessary during review. Jonnie Kind, MD

## 2019-01-09 ENCOUNTER — Other Ambulatory Visit: Payer: Self-pay

## 2019-01-09 ENCOUNTER — Encounter: Payer: Self-pay | Admitting: Obstetrics and Gynecology

## 2019-01-09 ENCOUNTER — Ambulatory Visit (INDEPENDENT_AMBULATORY_CARE_PROVIDER_SITE_OTHER): Payer: Medicaid Other | Admitting: Obstetrics and Gynecology

## 2019-01-09 VITALS — BP 122/76 | HR 108 | Wt 212.8 lb

## 2019-01-09 DIAGNOSIS — Z3A39 39 weeks gestation of pregnancy: Secondary | ICD-10-CM

## 2019-01-09 DIAGNOSIS — Z3483 Encounter for supervision of other normal pregnancy, third trimester: Secondary | ICD-10-CM

## 2019-01-09 NOTE — Progress Notes (Signed)
Subjective:  Julie Atkins is a 23 y.o. G2P1001 at [redacted]w[redacted]d being seen today for ongoing prenatal care.  She is currently monitored for the following issues for this low-risk pregnancy and has Generalized abdominal pain; Simple constipation; Intestinal bacterial overgrowth; Short interval between pregnancies affecting pregnancy, antepartum; Supervision of normal pregnancy; Late prenatal care in second trimester; and History of postpartum hemorrhage on their problem list.  Patient reports general discomforts of pregnancy.  Contractions: Not present. Vag. Bleeding: None.  Movement: Present. Denies leaking of fluid.   The following portions of the patient's history were reviewed and updated as appropriate: allergies, current medications, past family history, past medical history, past social history, past surgical history and problem list. Problem list updated.  Objective:   Vitals:   01/09/19 1555  BP: 122/76  Pulse: (!) 108  Weight: 212 lb 12.8 oz (96.5 kg)    Fetal Status:     Movement: Present     General:  Alert, oriented and cooperative. Patient is in no acute distress.  Skin: Skin is warm and dry. No rash noted.   Cardiovascular: Normal heart rate noted  Respiratory: Normal respiratory effort, no problems with respiration noted  Abdomen: Soft, gravid, appropriate for gestational age. Pain/Pressure: Absent     Pelvic:  Cervical exam performed        Extremities: Normal range of motion.  Edema: None  Mental Status: Normal mood and affect. Normal behavior. Normal judgment and thought content.   Urinalysis:      Assessment and Plan:  Pregnancy: G2P1001 at [redacted]w[redacted]d  1. Encounter for supervision of other normal pregnancy in third trimester Stable Labor precautions Schedule IOL at next OB visit BPP at next visit d/t PD  Term labor symptoms and general obstetric precautions including but not limited to vaginal bleeding, contractions, leaking of fluid and fetal movement were reviewed in  detail with the patient. Please refer to After Visit Summary for other counseling recommendations.  Return in about 1 week (around 01/16/2019) for OB visit, face to face and BPP d/t post dates.   Chancy Milroy, MD

## 2019-01-09 NOTE — Patient Instructions (Signed)

## 2019-01-17 ENCOUNTER — Encounter: Payer: Self-pay | Admitting: *Deleted

## 2019-01-17 ENCOUNTER — Other Ambulatory Visit: Payer: Self-pay

## 2019-01-17 ENCOUNTER — Ambulatory Visit (INDEPENDENT_AMBULATORY_CARE_PROVIDER_SITE_OTHER): Payer: Medicaid Other | Admitting: Obstetrics and Gynecology

## 2019-01-17 VITALS — BP 136/82 | HR 103 | Wt 214.2 lb

## 2019-01-17 DIAGNOSIS — Z331 Pregnant state, incidental: Secondary | ICD-10-CM

## 2019-01-17 DIAGNOSIS — O480 Post-term pregnancy: Secondary | ICD-10-CM

## 2019-01-17 DIAGNOSIS — Z3483 Encounter for supervision of other normal pregnancy, third trimester: Secondary | ICD-10-CM

## 2019-01-17 DIAGNOSIS — Z3A40 40 weeks gestation of pregnancy: Secondary | ICD-10-CM

## 2019-01-17 DIAGNOSIS — Z1389 Encounter for screening for other disorder: Secondary | ICD-10-CM

## 2019-01-17 LAB — POCT URINALYSIS DIPSTICK OB
Blood, UA: NEGATIVE
Glucose, UA: NEGATIVE
Nitrite, UA: NEGATIVE

## 2019-01-17 NOTE — Progress Notes (Signed)
Patient ID: Julie Atkins, female   DOB: [birth year], 23 y.o.   MRN: [id number]   LOW-RISK PREGNANCY VISIT Patient name: Julie Atkins MRN [id number]  Date of birth: [birth year] Chief Complaint:   Routine Prenatal Visit (NST)  History of Present Illness:   Julie Atkins is a 23 y.o. G31P1001 female at [redacted]w[redacted]d with an Estimated Date of Delivery: 01/14/19 being seen today for ongoing management of a low-risk pregnancy.  Today she reports no complaints. She has not been taking her PO Iron consistently because she forgets. This is her second child and she plans on breastfeeding.  Contractions: Not present. Vag. Bleeding: None.  Movement: Present. denies leaking of fluid. Review of Systems:   Pertinent items are noted in HPI Denies abnormal vaginal discharge w/ itching/odor/irritation, headaches, visual changes, shortness of breath, chest pain, abdominal pain, severe nausea/vomiting, or problems with urination or bowel movements unless otherwise stated above. Pertinent History Reviewed:  Reviewed past medical,surgical, social, obstetrical and family history.  Reviewed problem list, medications and allergies. Physical Assessment:   Vitals:   01/17/19 1502  BP: 136/82  Pulse: (!) 103  Weight: 214 lb 3.2 oz (97.2 kg)  Body mass index is 40.47 kg/m.        Physical Examination:   General appearance: Well appearing, and in no distress  Mental status: Alert, oriented to person, place, and time  Skin: Warm & dry  Cardiovascular: Normal heart rate noted  Respiratory: Normal respiratory effort, no distress  Abdomen: Soft, gravid, nontender  Pelvic: Cervical exam performed poster, long Dilation: 1      Extremities: Edema: None  Fetal Status: Fetal Heart Rate (bpm): 140 with accels to 160 Fundal Height: 40 cm Movement: Present   no contractions, good uterine tone  Results for orders placed or performed in visit on 01/17/19 (from the past 24 hour(s))  POC Urinalysis Dipstick OB   Collection  Time: 01/17/19  3:24 PM  Result Value Ref Range   Color, UA     Clarity, UA     Glucose, UA Negative Negative   Bilirubin, UA     Ketones, UA mod    Spec Grav, UA     Blood, UA neg    pH, UA     POC,PROTEIN,UA Small (1+) Negative, Trace, Small (1+), Moderate (2+), Large (3+), 4+   Urobilinogen, UA     Nitrite, UA neg    Leukocytes, UA Moderate (2+) (A) Negative   Appearance     Odor      Assessment & Plan:  1) Low-risk pregnancy G2P1001 at [redacted]w[redacted]d with an Estimated Date of Delivery: 01/14/19   2) 01/17/2019 Fingerstick HGB at 8.3 > pt has not been taking her PO Iron consistently  3) Pt would like IUD placed right after birth   Plan:   1) Continue routine obstetrical care   2) IOL scheduled for 01/21/2019 @ 6:30am  Meds: No orders of the defined types were placed in this encounter.  Labs/procedures today: NST, fingerstick HGB at 8.3  Reviewed: Term labor symptoms and general obstetric precautions including but not limited to vaginal bleeding, contractions, leaking of fluid and fetal movement were reviewed in detail with the patient.  All questions were answered  Follow-up: No follow-ups on file.  Orders Placed This Encounter  Procedures  . POC Urinalysis Dipstick OB   By signing my name below, I, De Burrs, attest that this documentation has been prepared under the direction and in the presence of Jonnie Kind, MD Electronically Signed:  Pietro CassisEmily Tufford, Medical Scribe. 01/17/19. 4:22 PM.  I personally performed the services described in this documentation, which was SCRIBED in my presence. The recorded information has been reviewed and considered accurate. It has been edited as necessary during review. Tilda BurrowJohn V Reis Pienta, MD

## 2019-01-18 ENCOUNTER — Inpatient Hospital Stay (HOSPITAL_COMMUNITY): Payer: Medicaid Other | Admitting: Anesthesiology

## 2019-01-18 ENCOUNTER — Inpatient Hospital Stay (HOSPITAL_COMMUNITY)
Admission: AD | Admit: 2019-01-18 | Discharge: 2019-01-19 | DRG: 807 | Disposition: A | Discharge status: Home / Self Care | Payer: Medicaid Other | Attending: Obstetrics & Gynecology | Admitting: Obstetrics & Gynecology

## 2019-01-18 ENCOUNTER — Encounter (HOSPITAL_COMMUNITY): Payer: Self-pay

## 2019-01-18 ENCOUNTER — Other Ambulatory Visit: Payer: Self-pay

## 2019-01-18 DIAGNOSIS — Z20828 Contact with and (suspected) exposure to other viral communicable diseases: Secondary | ICD-10-CM | POA: Diagnosis present

## 2019-01-18 DIAGNOSIS — O09899 Supervision of other high risk pregnancies, unspecified trimester: Secondary | ICD-10-CM

## 2019-01-18 DIAGNOSIS — Z3A40 40 weeks gestation of pregnancy: Secondary | ICD-10-CM

## 2019-01-18 DIAGNOSIS — Z3483 Encounter for supervision of other normal pregnancy, third trimester: Secondary | ICD-10-CM

## 2019-01-18 DIAGNOSIS — Z8759 Personal history of other complications of pregnancy, childbirth and the puerperium: Secondary | ICD-10-CM

## 2019-01-18 DIAGNOSIS — O480 Post-term pregnancy: Secondary | ICD-10-CM | POA: Diagnosis not present

## 2019-01-18 DIAGNOSIS — O4292 Full-term premature rupture of membranes, unspecified as to length of time between rupture and onset of labor: Principal | ICD-10-CM | POA: Diagnosis present

## 2019-01-18 DIAGNOSIS — O9081 Anemia of the puerperium: Secondary | ICD-10-CM | POA: Diagnosis not present

## 2019-01-18 DIAGNOSIS — O4290 Premature rupture of membranes, unspecified as to length of time between rupture and onset of labor, unspecified weeks of gestation: Secondary | ICD-10-CM | POA: Diagnosis present

## 2019-01-18 DIAGNOSIS — O0932 Supervision of pregnancy with insufficient antenatal care, second trimester: Secondary | ICD-10-CM

## 2019-01-18 LAB — TYPE AND SCREEN
ABO/RH(D): AB POS
Antibody Screen: NEGATIVE

## 2019-01-18 LAB — CBC
HCT: 32.3 % — ABNORMAL LOW (ref 36.0–46.0)
Hemoglobin: 10.1 g/dL — ABNORMAL LOW (ref 12.0–15.0)
MCH: 24.6 pg — ABNORMAL LOW (ref 26.0–34.0)
MCHC: 31.3 g/dL (ref 30.0–36.0)
MCV: 78.8 fL — ABNORMAL LOW (ref 80.0–100.0)
Platelets: 291 10*3/uL (ref 150–400)
RBC: 4.1 MIL/uL (ref 3.87–5.11)
RDW: 15.9 % — ABNORMAL HIGH (ref 11.5–15.5)
WBC: 10.3 10*3/uL (ref 4.0–10.5)
nRBC: 0 % (ref 0.0–0.2)

## 2019-01-18 LAB — ABO/RH: ABO/RH(D): AB POS

## 2019-01-18 LAB — SARS CORONAVIRUS 2 BY RT PCR (HOSPITAL ORDER, PERFORMED IN ~~LOC~~ HOSPITAL LAB): SARS Coronavirus 2: NEGATIVE

## 2019-01-18 LAB — AMNISURE RUPTURE OF MEMBRANE (ROM) NOT AT ARMC: Amnisure ROM: POSITIVE

## 2019-01-18 MED ORDER — IBUPROFEN 600 MG PO TABS
600.0000 mg | ORAL_TABLET | Freq: Four times a day (QID) | ORAL | Status: DC
  Administered 2019-01-18 – 2019-01-19 (×4): 600 mg via ORAL
  Filled 2019-01-18 (×4): qty 1

## 2019-01-18 MED ORDER — LIDOCAINE HCL (PF) 1 % IJ SOLN
INTRAMUSCULAR | Status: DC | PRN
  Administered 2019-01-18: 11 mL via EPIDURAL

## 2019-01-18 MED ORDER — ONDANSETRON HCL 4 MG/2ML IJ SOLN: 4.0000 mg | Freq: Four times a day (QID) | INTRAMUSCULAR | Status: DC | PRN

## 2019-01-18 MED ORDER — PHENYLEPHRINE 40 MCG/ML (10ML) SYRINGE FOR IV PUSH (FOR BLOOD PRESSURE SUPPORT): 80.0000 ug | PREFILLED_SYRINGE | INTRAVENOUS | Status: DC | PRN

## 2019-01-18 MED ORDER — OXYCODONE-ACETAMINOPHEN 5-325 MG PO TABS: 2.0000 | ORAL_TABLET | ORAL | Status: DC | PRN

## 2019-01-18 MED ORDER — LACTATED RINGERS IV SOLN
INTRAVENOUS | Status: DC
  Administered 2019-01-18 (×3): via INTRAVENOUS

## 2019-01-18 MED ORDER — PHENYLEPHRINE 40 MCG/ML (10ML) SYRINGE FOR IV PUSH (FOR BLOOD PRESSURE SUPPORT)
80.0000 ug | PREFILLED_SYRINGE | INTRAVENOUS | Status: DC | PRN
  Filled 2019-01-18: qty 10

## 2019-01-18 MED ORDER — FLEET ENEMA 7-19 GM/118ML RE ENEM: 1.0000 | ENEMA | RECTAL | Status: DC | PRN

## 2019-01-18 MED ORDER — OXYTOCIN 40 UNITS IN NORMAL SALINE INFUSION - SIMPLE MED
2.5000 [IU]/h | INTRAVENOUS | Status: DC
  Administered 2019-01-18: 2.5 [IU]/h via INTRAVENOUS

## 2019-01-18 MED ORDER — OXYTOCIN BOLUS FROM INFUSION
500.0000 mL | Freq: Once | INTRAVENOUS | Status: AC
  Administered 2019-01-18: 19:00:00 500 mL via INTRAVENOUS

## 2019-01-18 MED ORDER — BENZOCAINE-MENTHOL 20-0.5 % EX AERO: 1.0000 "application " | INHALATION_SPRAY | CUTANEOUS | Status: DC | PRN

## 2019-01-18 MED ORDER — COCONUT OIL OIL: 1.0000 "application " | TOPICAL_OIL | Status: DC | PRN

## 2019-01-18 MED ORDER — TETANUS-DIPHTH-ACELL PERTUSSIS 5-2.5-18.5 LF-MCG/0.5 IM SUSP: 0.5000 mL | Freq: Once | INTRAMUSCULAR | Status: DC

## 2019-01-18 MED ORDER — DIPHENHYDRAMINE HCL 25 MG PO CAPS: 25.0000 mg | ORAL_CAPSULE | Freq: Four times a day (QID) | ORAL | Status: DC | PRN

## 2019-01-18 MED ORDER — LIDOCAINE HCL (PF) 1 % IJ SOLN: 30.0000 mL | INTRAMUSCULAR | Status: DC | PRN

## 2019-01-18 MED ORDER — LACTATED RINGERS IV SOLN: 500.0000 mL | INTRAVENOUS | Status: DC | PRN

## 2019-01-18 MED ORDER — ZOLPIDEM TARTRATE 5 MG PO TABS: 5.0000 mg | ORAL_TABLET | Freq: Every evening | ORAL | Status: DC | PRN

## 2019-01-18 MED ORDER — OXYCODONE-ACETAMINOPHEN 5-325 MG PO TABS: 1.0000 | ORAL_TABLET | ORAL | Status: DC | PRN

## 2019-01-18 MED ORDER — ONDANSETRON HCL 4 MG PO TABS: 4.0000 mg | ORAL_TABLET | ORAL | Status: DC | PRN

## 2019-01-18 MED ORDER — DIBUCAINE (PERIANAL) 1 % EX OINT: 1.0000 "application " | TOPICAL_OINTMENT | CUTANEOUS | Status: DC | PRN

## 2019-01-18 MED ORDER — ACETAMINOPHEN 325 MG PO TABS: 650.0000 mg | ORAL_TABLET | ORAL | Status: DC | PRN

## 2019-01-18 MED ORDER — FENTANYL-BUPIVACAINE-NACL 0.5-0.125-0.9 MG/250ML-% EP SOLN
12.0000 mL/h | EPIDURAL | Status: DC | PRN
  Filled 2019-01-18: qty 250

## 2019-01-18 MED ORDER — EPHEDRINE 5 MG/ML INJ: 10.0000 mg | INTRAVENOUS | Status: DC | PRN

## 2019-01-18 MED ORDER — FENTANYL CITRATE (PF) 100 MCG/2ML IJ SOLN: 100.0000 ug | INTRAMUSCULAR | Status: DC | PRN

## 2019-01-18 MED ORDER — ONDANSETRON HCL 4 MG/2ML IJ SOLN: 4.0000 mg | INTRAMUSCULAR | Status: DC | PRN

## 2019-01-18 MED ORDER — SODIUM CHLORIDE (PF) 0.9 % IJ SOLN
INTRAMUSCULAR | Status: DC | PRN
  Administered 2019-01-18: 12 mL/h via EPIDURAL

## 2019-01-18 MED ORDER — WITCH HAZEL-GLYCERIN EX PADS: 1.0000 "application " | MEDICATED_PAD | CUTANEOUS | Status: DC | PRN

## 2019-01-18 MED ORDER — SENNOSIDES-DOCUSATE SODIUM 8.6-50 MG PO TABS
2.0000 | ORAL_TABLET | ORAL | Status: DC
  Administered 2019-01-18: 2 via ORAL
  Filled 2019-01-18: qty 2

## 2019-01-18 MED ORDER — TERBUTALINE SULFATE 1 MG/ML IJ SOLN: 0.2500 mg | Freq: Once | INTRAMUSCULAR | Status: DC | PRN

## 2019-01-18 MED ORDER — OXYTOCIN 40 UNITS IN NORMAL SALINE INFUSION - SIMPLE MED
1.0000 m[IU]/min | INTRAVENOUS | Status: DC
  Administered 2019-01-18: 2 m[IU]/min via INTRAVENOUS
  Filled 2019-01-18: qty 1000

## 2019-01-18 MED ORDER — SIMETHICONE 80 MG PO CHEW: 80.0000 mg | CHEWABLE_TABLET | ORAL | Status: DC | PRN

## 2019-01-18 MED ORDER — LACTATED RINGERS IV SOLN: 500.0000 mL | Freq: Once | INTRAVENOUS | Status: DC

## 2019-01-18 MED ORDER — DIPHENHYDRAMINE HCL 50 MG/ML IJ SOLN: 12.5000 mg | INTRAMUSCULAR | Status: DC | PRN

## 2019-01-18 MED ORDER — ACETAMINOPHEN 325 MG PO TABS
650.0000 mg | ORAL_TABLET | ORAL | Status: DC | PRN
  Administered 2019-01-19 (×2): 650 mg via ORAL
  Filled 2019-01-18 (×2): qty 2

## 2019-01-18 MED ORDER — SOD CITRATE-CITRIC ACID 500-334 MG/5ML PO SOLN: 30.0000 mL | ORAL | Status: DC | PRN

## 2019-01-18 MED ORDER — PRENATAL MULTIVITAMIN CH
1.0000 | ORAL_TABLET | Freq: Every day | ORAL | Status: DC
  Administered 2019-01-19: 12:00:00 1 via ORAL
  Filled 2019-01-18: qty 1

## 2019-01-18 NOTE — Anesthesia Preprocedure Evaluation (Signed)

## 2019-01-18 NOTE — Progress Notes (Signed)
Pt informed that the ultrasound is considered a limited OB ultrasound and is not intended to be a complete ultrasound exam.  Patient also informed that the ultrasound is not being completed with the intent of assessing for fetal or placental anomalies or any pelvic abnormalities.  Explained that the purpose of today's ultrasound is to assess for  presentation.  Patient acknowledges the purpose of the exam and the limitations of the study.    Cephalic  Michalina Calbert, NP   

## 2019-01-18 NOTE — Anesthesia Procedure Notes (Signed)
Epidural Patient location during procedure: OB Start time: 01/18/2019 2:27 PM End time: 01/18/2019 2:40 PM  Staffing Anesthesiologist: Lynda Rainwater, MD Performed: anesthesiologist   Preanesthetic Checklist Completed: patient identified, site marked, surgical consent, pre-op evaluation, timeout performed, IV checked, risks and benefits discussed and monitors and equipment checked  Epidural Patient position: sitting Prep: ChloraPrep Patient monitoring: heart rate, cardiac monitor, continuous pulse ox and blood pressure Approach: midline Location: L2-L3 Injection technique: LOR saline  Needle:  Needle type: Tuohy  Needle gauge: 17 G Needle length: 9 cm Needle insertion depth: 6 cm Catheter type: closed end flexible Catheter size: 20 Guage Catheter at skin depth: 10 cm Test dose: negative  Assessment Events: blood not aspirated, injection not painful, no injection resistance, negative IV test and no paresthesia  Additional Notes Reason for block:procedure for pain

## 2019-01-18 NOTE — Progress Notes (Signed)
LABOR PROGRESS NOTE  Julie Atkins is a 23 y.o. G2P1001 at [redacted]w[redacted]d  admitted for PROM.  Subjective: She is sitting up in bed eating. She is not experiencing discomfort at this time.   Objective: BP 126/69   Pulse 87   Temp 98.9 F (37.2 C) (Oral)   Resp 16   Ht 5\' 1"  (1.549 m)   Wt 96.2 kg   LMP  (LMP Unknown)   BMI 40.06 kg/m  or  Vitals:   01/18/19 0823 01/18/19 1123 01/18/19 1302  BP: 129/81 131/77 126/69  Pulse: (!) 120 92 87  Resp: 16 18 16   Temp: 98.6 F (37 C)  98.9 F (37.2 C)  TempSrc:   Oral  Weight:  96.2 kg   Height:  5\' 1"  (1.549 m)      Dilation: 4 Effacement (%): 50 Cervical Position: Posterior Station: -1 Presentation: Vertex Exam by:: Army Fossa RN FHT: baseline rate 125bpm, moderate varibility, 15 x 15 acel, no decel Toco: 2-74mins  Labs: Lab Results  Component Value Date   WBC 10.3 01/18/2019   HGB 10.1 (L) 01/18/2019   HCT 32.3 (L) 01/18/2019   MCV 78.8 (L) 01/18/2019   PLT 291 01/18/2019    Patient Active Problem List   Diagnosis Date Noted  . Full-term premature rupture of membranes (PROM) with unknown onset of labor 01/18/2019  . Supervision of normal pregnancy 09/19/2018  . Late prenatal care in second trimester 09/19/2018  . History of postpartum hemorrhage 09/19/2018  . Short interval between pregnancies affecting pregnancy, antepartum 06/29/2018  . Intestinal bacterial overgrowth 06/18/2012  . Simple constipation 05/07/2012  . Generalized abdominal pain     Assessment / Plan: 23 y.o. G2P1001 at [redacted]w[redacted]d here for PROM.  Labor: Consider pitocin after patient eats and at next cervical exam. Continue time appropriate cervical exams.  Fetal Wellbeing: Cat I  Pain Control:  Maternally supported Anticipated MOD: Vaginal  Gerlene Fee, DO [place] PGY-1 01/18/2019, 1:38 PM '

## 2019-01-18 NOTE — H&P (Signed)
Obstetric History and Physical  Julie Atkins is a 23 y.o. G2P1001 with IUP at [redacted]w[redacted]d presenting for SROM. Leaking clear fluid since 0730 this morning. Some irregular cramping but not regular contractions. No vaginal bleeding. Good fetal movement. SROM confirmed by positive amnisure. Denies complications with this pregnancy. Previous pregnancy complicated by fetal macrosomia & PPH.   Prenatal Course Source of Care: [facility]  with onset of care at 23 weeks Pregnancy complications or risks: Patient Active Problem List   Diagnosis Date Noted  . Supervision of normal pregnancy 10/05/18  . Late prenatal care in second trimester 05-Oct-2018  . History of postpartum hemorrhage 10/05/18  . Short interval between pregnancies affecting pregnancy, antepartum 06/29/2018  . Intestinal bacterial overgrowth 06/18/2012  . Simple constipation 05/07/2012  . Generalized abdominal pain    She plans to breastfeed She desires post placental IUD for postpartum contraception.   Prenatal labs and studies: ABO, Rh: AB/Positive/-- 2022-10-05 1114) Antibody: Negative (05/13 0918) Rubella: 1.32 Oct 05, 2022 1114) RPR: Non Reactive (05/13 0918)  HBsAg: Negative 2022-10-05 1114)  HIV: Non Reactive (05/13 0918)  OIZ:TIWPYKDX (07/16 0000) 2hr GTT:  passed Genetic screening normal Anatomy US normal  Prenatal Transfer Tool  Maternal Diabetes: No Genetic Screening: Normal Maternal Ultrasounds/Referrals: Isolated EIF (echogenic intracardiac focus) Fetal Ultrasounds or other Referrals:  None Maternal Substance Abuse:  No Significant Maternal Medications:  None Significant Maternal Lab Results: None  Past Medical History:  Diagnosis Date  . Abdominal pain     Past Surgical History:  Procedure Laterality Date  . TONSILLECTOMY    . TYMPANOTOMY  2004    OB History  Gravida Para Term Preterm AB Living  2 1 1     1   SAB TAB Ectopic Multiple Live Births          1    # Outcome Date GA Lbr Len/2nd Weight  Sex Delivery Anes PTL Lv  2 Current           1 Term 01/31/18 [redacted]w[redacted]d  4111 g M Vag-Spont EPI  LIV    Social History   Socioeconomic History  . Marital status: Single    Spouse name: Not on file  . Number of children: 1  . Years of education: Not on file  . Highest education level: Not on file  Occupational History  . Not on file  Social Needs  . Financial resource strain: Not on file  . Food insecurity    Worry: Not on file    Inability: Not on file  . Transportation needs    Medical: Not on file    Non-medical: Not on file  Tobacco Use  . Smoking status: Never Smoker  . Smokeless tobacco: Never Used  Substance and Sexual Activity  . Alcohol use: Not Currently  . Drug use: Not Currently  . Sexual activity: Yes    Birth control/protection: None  Lifestyle  . Physical activity    Days per week: Not on file    Minutes per session: Not on file  . Stress: Not on file  Relationships  . Social Herbalist on phone: Not on file    Gets together: Not on file    Attends religious service: Not on file    Active member of club or organization: Not on file    Attends meetings of clubs or organizations: Not on file    Relationship status: Not on file  Other Topics Concern  . Not on file  Social History Narrative  9th grade    Family History  Problem Relation Age of Onset  . Hypertension Father     Medications Prior to Admission  Medication Sig Dispense Refill Last Dose  . Ferrous Sulfate (IRON PO) Take by mouth daily.     . Prenatal Multivit-Min-Fe-FA (PRENATAL VITAMINS) 0.8 MG tablet Take 1 tablet by mouth daily. 30 tablet 12     No Known Allergies  Review of Systems: Negative except for what is mentioned in HPI.  Physical Exam: BP 129/81   Pulse (!) 120   Temp 98.6 F (37 C)   Resp 16   LMP  (LMP Unknown)  CONSTITUTIONAL: Well-developed, well-nourished female in no acute distress.  HENT:  Normocephalic, atraumatic, External right and left ear  normal. Oropharynx is clear and moist EYES: Conjunctivae and EOM are normal. Pupils are equal, round, and reactive to light. No scleral icterus.  NECK: Normal range of motion, supple, no masses SKIN: Skin is warm and dry. No rash noted. Not diaphoretic. No erythema. No pallor. NEUROLOGIC: Alert and oriented to person, place, and time. Normal reflexes, muscle tone coordination. No cranial nerve deficit noted. PSYCHIATRIC: Normal mood and affect. Normal behavior. Normal judgment and thought content. CARDIOVASCULAR: Normal heart rate noted, regular rhythm RESPIRATORY: Effort and breath sounds normal, no problems with respiration noted ABDOMEN: Soft, nontender, nondistended, gravid. MUSCULOSKELETAL: Normal range of motion. No edema and no tenderness. 2+ distal pulses.  Cervical Exam: Presentation: Vertex(per bedside ultrasound)   Presentation: cephalic FHT:  NST:  Baseline: 135 bpm, Variability: Good {> 6 bpm), Accelerations: Reactive and Decelerations: Absent   Pertinent Labs/Studies:   Results for orders placed or performed during the hospital encounter of 01/18/19 (from the past 24 hour(s))  Amnisure rupture of membrane (rom)[facility]     Status: None   Collection Time: 01/18/19  9:01 AM  Result Value Ref Range   Amnisure ROM POSITIVE     Assessment : Jeb LeveringDanielle Atkins is a 23 y.o. G2P1001 at 10473w4d being admitted for SROM  Plan: Labor: Expectant management  Analgesia as needed. FWB: Reassuring fetal heart tracing.   GBS negative Delivery plan: Hopeful for vaginal delivery   Judeth HornLawrence, Denzell Colasanti, NP

## 2019-01-18 NOTE — Progress Notes (Signed)
LABOR PROGRESS NOTE  Julie Atkins is a 23 y.o. G2P1001 at [redacted]w[redacted]d  admitted for PROM.  Subjective: She is in bed resting. She is not in any discomfort at this time.   Objective: BP (!) 108/41   Pulse 95   Temp 98.4 F (36.9 C) (Oral)   Resp 18   Ht 5\' 1"  (1.549 m)   Wt 96.2 kg   LMP  (LMP Unknown)   SpO2 97%   BMI 40.06 kg/m  or  Vitals:   01/18/19 1901 01/18/19 1930 01/18/19 1935 01/18/19 1936  BP: 96/60 122/66 (!) 108/41   Pulse: 91 92 95   Resp: 16 18 18    Temp:    98.4 F (36.9 C)  TempSrc:    Oral  SpO2:      Weight:      Height:         Dilation: 4 Effacement (%): 90 Cervical Position: Middle Station: -1 Presentation: Vertex Exam by:: Army Fossa RN FHT: baseline rate 125bpm, moderate varibility, 15 x 15 acel, no decel Toco: 1-3 mins  Labs: Lab Results  Component Value Date   WBC 10.3 01/18/2019   HGB 10.1 (L) 01/18/2019   HCT 32.3 (L) 01/18/2019   MCV 78.8 (L) 01/18/2019   PLT 291 01/18/2019    Patient Active Problem List   Diagnosis Date Noted  . Full-term premature rupture of membranes (PROM) with unknown onset of labor 01/18/2019  . Supervision of normal pregnancy 09/19/2018  . Late prenatal care in second trimester 09/19/2018  . History of postpartum hemorrhage 09/19/2018  . Short interval between pregnancies affecting pregnancy, antepartum 06/29/2018  . Intestinal bacterial overgrowth 06/18/2012  . Simple constipation 05/07/2012  . Generalized abdominal pain     Assessment / Plan: 23 y.o. G2P1001 at [redacted]w[redacted]d here for SROM.  Labor: Placed IUPC and FSE. Continue time appropriate cervical exams. Fetal Wellbeing: Cat I, Vertex Pain Control:  Epidural Anticipated MOD: Vaginal  Gerlene Fee, DO [place] PGY-1 01/18/2019, 7:39 PM

## 2019-01-18 NOTE — MAU Note (Signed)
.   Julie Atkins is a 23 y.o. at [redacted]w[redacted]d here in MAU reporting: leakage of clear fluid that started this morning at 730 with contractions  Onset of complaint: 0730 Pain score: 4 Vitals:   01/18/19 0823  BP: 129/81  Pulse: (!) 120  Resp: 16  Temp: 98.6 F (37 C)     FHT:130 Lab orders placed from triage:

## 2019-01-18 NOTE — MAU Note (Signed)
Bedside US performed by NP erin lawrence.  Vertex presentation confirmed.

## 2019-01-18 NOTE — Discharge Summary (Signed)
Postpartum Discharge Summary     Patient Name: Julie Atkins DOB: 1996-02-15 MRN: [id number]  Date of admission: 01/18/2019 Delivering Provider: Gerlene Fee   Date of discharge: 01/19/2019  Admitting diagnosis: 40WKS WATER BROKE Intrauterine pregnancy: [redacted]w[redacted]d     Secondary diagnosis:  Active Problems:   Short interval between pregnancies affecting pregnancy, antepartum   Late prenatal care in second trimester   History of postpartum hemorrhage   Full-term premature rupture of membranes (PROM) with unknown onset of labor   Vaginal delivery  Additional problems: none     Discharge diagnosis: Term Pregnancy Delivered                                                                                                Post partum procedures:none (pt initially wanted postplacental IUD- now wants it outpatient)  Augmentation: Pitocin  Complications: None  Hospital course:  Induction of Labor With Vaginal Delivery   23 y.o. yo [id number] at [redacted]w[redacted]d was admitted to the hospital 01/18/2019 for induction of labor.  Indication for induction: PROM at 0730. Pitocin was started and she progress to SVD that that evening after an uncomplicated labor course as follows: Membrane Rupture Time/Date: 7:30 AM ,01/18/2019   Intrapartum Procedures: Episiotomy: None [1]                                         Lacerations:  None [1]  Patient had delivery of a Viable infant.  Information for the patient's newborn:  Mayerli, Kirst [[id number]]  Delivery Method: Vaginal, Spontaneous(Filed from Delivery Summary)    01/18/2019  Details of delivery can be found in separate delivery note.  Patient had a routine postpartum course. Patient is discharged home 01/19/19.  Magnesium Sulfate recieved: No BMZ received: No  Physical exam  Vitals:   01/19/19 0300 01/19/19 0631 01/19/19 0931 01/19/19 1418  BP: (!) 98/50 102/61 112/71 (!) 93/53  Pulse: 78 70 69 63  Resp: 16 15 18 16   Temp: 98.3 F (36.8 C)  97.9 F (36.6 C) 98.2 F (36.8 C) 98.4 F (36.9 C)  TempSrc: Oral Oral  Oral  SpO2: 100% 100%  100%  Weight:      Height:       General: alert, cooperative and no distress Lochia: appropriate Uterine Fundus: firm Incision: N/A DVT Evaluation: No evidence of DVT seen on physical exam. Labs: Lab Results  Component Value Date   WBC 13.2 (H) 01/19/2019   HGB 8.9 (L) 01/19/2019   HCT 28.4 (L) 01/19/2019   MCV 78.0 (L) 01/19/2019   PLT 271 01/19/2019   CMP Latest Ref Rng & Units 08/14/2012  Glucose 70 - 99 mg/dL 93  BUN 6 - 23 mg/dL 7  Creatinine 0.47 - 1.00 mg/dL 0.70  Sodium 135 - 145 mEq/L 138  Potassium 3.5 - 5.1 mEq/L 3.7  Chloride 96 - 112 mEq/L 101  CO2 19 - 32 mEq/L 26  Calcium 8.4 - 10.5 mg/dL 10.1  Total Protein 6.0 - 8.3 g/dL 8.2  Total Bilirubin 0.3 - 1.2 mg/dL 0.8  Alkaline Phos 50 - 162 U/L 72  AST 0 - 37 U/L 14  ALT 0 - 35 U/L 9    Discharge instruction: per After Visit Summary and "Baby and Me Booklet".  After visit meds:  Allergies as of 01/19/2019   No Known Allergies     Medication List    TAKE these medications   ibuprofen 600 MG tablet Commonly known as: ADVIL Take 1 tablet (600 mg total) by mouth every 6 (six) hours. Start taking on: January 20, 2019   IRON PO Take by mouth daily.   Prenatal Vitamins 0.8 MG tablet Take 1 tablet by mouth daily.       Diet: routine diet  Activity: Advance as tolerated. Pelvic rest for 6 weeks.   Outpatient follow up:4 weeks Follow up Appt: Future Appointments  Date Time Provider Department Center  02/21/2019  3:10 PM Cresenzo-Dishmon, Scarlette CalicoFrances, CNM [facility]   Follow up Visit:  Please schedule this patient for Postpartum visit in: 4 weeks with the following provider: Any provider For C/S patients schedule nurse incision check in weeks 2 weeks: no Low risk pregnancy complicated by: none Delivery mode:  SVD Anticipated Birth Control:  IUD- needs outpatient PP Procedures needed: none   Schedule Integrated BH visit: no    Newborn Data: Live born female  Birth Weight: 8 lb 0.6 oz (3646 g) APGAR: 9, 9  Newborn Delivery   Birth date/time: 01/18/2019 19:17:00 Delivery type: Vaginal, Spontaneous      Baby Feeding: Breast Disposition:home with mother   01/19/2019 Rolm Bookbinderaroline M Neill, CNM

## 2019-01-19 LAB — CBC
HCT: 28.4 % — ABNORMAL LOW (ref 36.0–46.0)
Hemoglobin: 8.9 g/dL — ABNORMAL LOW (ref 12.0–15.0)
MCH: 24.5 pg — ABNORMAL LOW (ref 26.0–34.0)
MCHC: 31.3 g/dL (ref 30.0–36.0)
MCV: 78 fL — ABNORMAL LOW (ref 80.0–100.0)
Platelets: 271 10*3/uL (ref 150–400)
RBC: 3.64 MIL/uL — ABNORMAL LOW (ref 3.87–5.11)
RDW: 15.8 % — ABNORMAL HIGH (ref 11.5–15.5)
WBC: 13.2 10*3/uL — ABNORMAL HIGH (ref 4.0–10.5)
nRBC: 0 % (ref 0.0–0.2)

## 2019-01-19 LAB — RPR: RPR Ser Ql: NONREACTIVE

## 2019-01-19 MED ORDER — OXYCODONE HCL 5 MG PO TABS
5.0000 mg | ORAL_TABLET | ORAL | Status: DC | PRN
  Administered 2019-01-19 (×2): 5 mg via ORAL
  Filled 2019-01-19 (×2): qty 1

## 2019-01-19 MED ORDER — IBUPROFEN 600 MG PO TABS: 600.0000 mg | ORAL_TABLET | Freq: Four times a day (QID) | ORAL | 0 refills | Status: DC

## 2019-01-19 NOTE — Lactation Note (Signed)
This note was copied from a baby's chart. Lactation Consultation Note  Patient Name: Julie Atkins CWCBJ'S Date: 01/19/2019 Reason for consult: Initial assessment;Term;1st time breastfeeding  P2 mother whose infant is now 37 hours old.  Mother did not breast feed her first child (now almost [age] year old).  Baby was asleep on mother's chest when I arrived.  Mother had no questions/concerns related to breast feeding.  Encouraged to feed 8-12 times/24 hours or sooner if baby shows feeding cues.  Suggested she perform hand expression before/after feedings to help increase milk supply.  Colostrum container provided and milk storage times reviewed.  Finger feeding demonstrated.  Mother will be a "stay at home" mother and has a DEBP for home use.  Suggested she call her RN/LC for latch assistance as needed..  Mother verbalized understanding.  Father present.   Maternal Data Formula Feeding for Exclusion: No Has patient been taught Hand Expression?: Yes Does the patient have breastfeeding experience prior to this delivery?: No  Feeding Feeding Type: Breast Fed  LATCH Score                   Interventions    Lactation Tools Discussed/Used     Consult Status Consult Status: Follow-up Date: 01/20/19 Follow-up type: In-patient    Luther Newhouse R Marija Calamari 01/19/2019, 4:40 PM

## 2019-01-19 NOTE — Progress Notes (Signed)
POSTPARTUM PROGRESS NOTE  Post Partum Day 1  Subjective:  Julie Atkins is a 23 y.o. [id number] s/p induced vaginal delivery at [redacted]w[redacted]d.  She reports she is doing well. No acute events overnight. She denies any problems with ambulating, voiding or po intake. Denies nausea or vomiting. Denies dizziness or lightheadedness. Pain is well controlled but feeling some intermittent "after-contractions."  Lochia is appropriate.  Objective: Blood pressure 102/61, pulse 70, temperature 97.9 F (36.6 C), temperature source Oral, resp. rate 15, height 5\' 1"  (1.549 m), weight 96.2 kg, SpO2 100 %, unknown if currently breastfeeding.  Physical Exam:  General: alert, cooperative and no distress Chest: no respiratory distress. CTAB. Heart:regular rate, distal pulses intact Abdomen: soft, nontender,  Uterine Fundus: firm, appropriately tender DVT Evaluation: No calf swelling or tenderness Extremities: No edema Skin: warm, dry  Recent Labs    01/18/19 1023 01/19/19 0600  HGB 10.1* 8.9*  HCT 32.3* 28.4*    Assessment/Plan: Julie Atkins is a 23 y.o. [id number] s/p induced vaginal delivery at [redacted]w[redacted]d.  PPD#1 - Doing well  Routine postpartum care Anemia. Hgb 8.9, per patient, she was anemic throughout her pregnancy and was                  taking iron at home. Asymptomatic this morning.               Consider starting ferrous sulfate Contraception: IUD (outpatient) Feeding: breast Dispo: Plan for discharge 8/15.   LOS: 1 day   [place] Student 01/19/2019, 7:59 AM

## 2019-01-19 NOTE — Discharge Instructions (Signed)

## 2019-01-19 NOTE — Anesthesia Postprocedure Evaluation (Signed)
Anesthesia Post Note  Patient: Julie Atkins  Procedure(s) Performed: AN AD Comfort     Patient location during evaluation: Mother Baby Anesthesia Type: Epidural Level of consciousness: awake and alert Pain management: pain level controlled Vital Signs Assessment: post-procedure vital signs reviewed and stable Respiratory status: spontaneous breathing, nonlabored ventilation and respiratory function stable Cardiovascular status: stable Postop Assessment: no headache, no backache and epidural receding Anesthetic complications: no    Last Vitals:  Vitals:   01/19/19 0300 01/19/19 0631  BP: (!) 98/50 102/61  Pulse: 78 70  Resp: 16 15  Temp: 36.8 C 36.6 C  SpO2: 100% 100%    Last Pain:  Vitals:   01/19/19 0726  TempSrc:   PainSc: 0-No pain   Pain Goal:                   Rayvon Char

## 2019-01-21 ENCOUNTER — Inpatient Hospital Stay (HOSPITAL_COMMUNITY): Payer: Medicaid Other

## 2019-01-21 ENCOUNTER — Inpatient Hospital Stay (HOSPITAL_COMMUNITY)
Admission: AD | Admit: 2019-01-21 | Payer: Medicaid Other | Source: Home / Self Care | Admitting: Obstetrics and Gynecology

## 2019-02-21 ENCOUNTER — Encounter: Payer: Self-pay | Admitting: Advanced Practice Midwife

## 2019-02-21 ENCOUNTER — Telehealth (INDEPENDENT_AMBULATORY_CARE_PROVIDER_SITE_OTHER): Payer: Medicaid Other | Admitting: Advanced Practice Midwife

## 2019-02-21 ENCOUNTER — Other Ambulatory Visit: Payer: Self-pay

## 2019-02-21 NOTE — Progress Notes (Signed)
TELEHEALTH VIRTUAL POSTPARTUM VISIT ENCOUNTER NOTE  I connected with@ on 02/21/19 at  3:10 PM EDT by telephone at home and verified that I am speaking with the correct person using two identifiers.   I discussed the limitations, risks, security and privacy concerns of performing an evaluation and management service by telephone and the availability of in person appointments. I also discussed with the patient that there may be a patient responsible charge related to this service. The patient expressed understanding and agreed to proceed.  Appointment Date: 02/21/2019  OBGYN Clinic: [facility]  Chief Complaint:  Postpartum Visit  History of Present Illness: Julie Atkins is a 23 y.o.  [phone] (No LMP recorded.), seen for the above chief complaint. Her past medical history is significant for nothing   She is s/p normal spontaneous vaginal delivery on 8/14 at 40+ weeks; she was discharged to home on PPD#2. Pregnancy complicated by nothing. Baby is doing well.  Complains of noting.  Had wanted PP IUD, didn't get it  Vaginal bleeding or discharge: Yes  Mode of feeding infant: Bottle and Breast (stopped bf a week ago) Intercourse: No  Contraception: IUD PP depression s/s: No .  Any bowel or bladder issues: No  Pap smear: no abnormalities (date: 10/17/18)  Review of Systems: Positive for some constipation w/FeS04.  . Her 12 point review of systems is negative or as noted in the History of Present Illness.  There are no active problems to display for this patient.   Medications Julie Atkins had no medications administered during this visit. Current Outpatient Medications  Medication Sig Dispense Refill   Ferrous Sulfate (IRON PO) Take by mouth daily.     ibuprofen (ADVIL) 600 MG tablet Take 1 tablet (600 mg total) by mouth every 6 (six) hours. (Patient not taking: Reported on 02/21/2019) 30 tablet 0   Prenatal Multivit-Min-Fe-FA (PRENATAL VITAMINS) 0.8 MG tablet Take 1  tablet by mouth daily. (Patient not taking: Reported on 02/21/2019) 30 tablet 12   No current facility-administered medications for this visit.     Allergies Patient has no known allergies.  Physical Exam:  General:  Alert, oriented and cooperative.   Mental Status: Normal mood and affect perceived. Normal judgment and thought content.  Rest of physical exam deferred due to type of encounter  PP Depression Screening:   Edinburgh Postnatal Depression Scale - 02/21/19 1558      Edinburgh Postnatal Depression Scale:  In the Past 7 Days   I have been able to laugh and see the funny side of things.  0    I have looked forward with enjoyment to things.  0    I have blamed myself unnecessarily when things went wrong.  0    I have been anxious or worried for no good reason.  0    I have felt scared or panicky for no good reason.  0    Things have been getting on top of me.  0    I have been so unhappy that I have had difficulty sleeping.  0    I have felt sad or miserable.  0    I have been so unhappy that I have been crying.  0    The thought of harming myself has occurred to me.  0    Edinburgh Postnatal Depression Scale Total  0       Assessment:Patient is a 23 y.o. P3A2505 who is 4 weeks postpartum from a normal spontaneous vaginal delivery.  She is doing well.   Plan: No sex until IUDr.  RTC 1-2 weeks for IUD  I discussed the assessment and treatment plan with the patient. The patient was provided an opportunity to ask questions and all were answered. The patient agreed with the plan and demonstrated an understanding of the instructions.   The patient was advised to call back or seek an in-person evaluation/go to the ED for any concerning postpartum symptoms.  I provided 10 minutes of non-face-to-face time during this encounter.   Jacklyn ShellFrances Cresenzo-Dishmon, CNM [facility], [facility]

## 2019-03-07 ENCOUNTER — Other Ambulatory Visit: Payer: Self-pay

## 2019-03-07 ENCOUNTER — Ambulatory Visit: Payer: Medicaid Other | Admitting: Advanced Practice Midwife

## 2019-03-14 ENCOUNTER — Ambulatory Visit (INDEPENDENT_AMBULATORY_CARE_PROVIDER_SITE_OTHER): Payer: Medicaid Other | Admitting: Advanced Practice Midwife

## 2019-03-14 ENCOUNTER — Other Ambulatory Visit: Payer: Self-pay

## 2019-03-14 ENCOUNTER — Encounter: Payer: Self-pay | Admitting: Advanced Practice Midwife

## 2019-03-14 ENCOUNTER — Other Ambulatory Visit: Payer: Medicaid Other

## 2019-03-14 VITALS — BP 117/71 | HR 84 | Wt 207.0 lb

## 2019-03-14 DIAGNOSIS — Z3202 Encounter for pregnancy test, result negative: Secondary | ICD-10-CM | POA: Diagnosis not present

## 2019-03-14 DIAGNOSIS — Z30430 Encounter for insertion of intrauterine contraceptive device: Secondary | ICD-10-CM

## 2019-03-14 LAB — POCT URINE PREGNANCY: Preg Test, Ur: NEGATIVE

## 2019-03-14 MED ORDER — LEVONORGESTREL 19.5 MCG/DAY IU IUD
INTRAUTERINE_SYSTEM | Freq: Once | INTRAUTERINE | Status: AC
  Administered 2019-03-14: 16:00:00 via INTRAUTERINE

## 2019-03-14 NOTE — Progress Notes (Signed)
Julie Atkins is a 23 y.o. year old  female   who presents for placement of a Liletta IUD. Her LMP was 5 days ago and her pregnancy test today is negative.    The risks and benefits of the method and placement have been thouroughly reviewed with the patient and all questions were answered.  Specifically the patient is aware of failure rate of 06/998, expulsion of the IUD and of possible perforation.  The patient is aware of irregular bleeding due to the method and understands the incidence of irregular bleeding diminishes with time.  Time out was performed.  A Graves speculum was placed.  The cervix was prepped using Betadine. The uterus was found to be neutral and it sounded to 9 cm.  The cervix was grasped with a tenaculum and the IUD was inserted to 9 cm.  It was pulled back 1 cm and the IUD was disengaged.  The strings were trimmed to 3 cm.  Sonogram was performed and the proper placement of the IUD was verified.  The patient was instructed on signs and symptoms of infection and to check for the strings after each menses or each month.  The patient is to refrain from intercourse for 3 days.  The patient is scheduled for a return appointment after her first menses or 4 weeks.  Joaquim Lai Cresenzo-Dishmon 03/14/2019 4:02 PM

## 2019-03-15 LAB — BETA HCG QUANT (REF LAB): hCG Quant: <1 m[IU]/mL

## 2019-03-18 ENCOUNTER — Ambulatory Visit (INDEPENDENT_AMBULATORY_CARE_PROVIDER_SITE_OTHER): Payer: Medicaid Other | Admitting: Advanced Practice Midwife

## 2019-03-18 ENCOUNTER — Other Ambulatory Visit: Payer: Self-pay

## 2019-03-18 ENCOUNTER — Encounter: Payer: Self-pay | Admitting: Advanced Practice Midwife

## 2019-03-18 VITALS — BP 117/68 | HR 64 | Ht 61.0 in | Wt 207.0 lb

## 2019-03-18 DIAGNOSIS — Z30431 Encounter for routine checking of intrauterine contraceptive device: Secondary | ICD-10-CM | POA: Diagnosis not present

## 2019-03-18 NOTE — Progress Notes (Signed)
History:  23 y.o. [id number] here today for today for IUD string check; Liletta IUD was placed  10/8.  A few days later, she felt something poking her in her vagina and noticed that strings were at her introitus (confirmed by boyfriend as being blue strings).  Shortly afterwards, the strings had receded and she hasn't seen them that low since. .   The following portions of the patient's history were reviewed and updated as appropriate: allergies, current medications, past family history, past medical history, past social history, past surgical history and problem list.  Review of Systems:   Constitutional: Negative for fever and chills Eyes: Negative for visual disturbances Respiratory: Negative for shortness of breath, dyspnea Cardiovascular: Negative for chest pain or palpitations  Gastrointestinal: Negative for vomiting, diarrhea and constipation Genitourinary: Negative for dysuria and urgency Musculoskeletal: Negative for back pain, joint pain, myalgias  Neurological: Negative for dizziness and headaches    Objective:  Physical Exam Blood pressure 117/68, pulse 64, height 5\' 1"  (1.549 m), weight 207 lb (93.9 kg), last menstrual period 03/07/2019, not currently breastfeeding. Gen: NAD Abd: Soft, nontender and nondistended Pelvic: Strings about 1" from cx os.  Trimmed to 1/2".  Best guess is that her uterus had been lower in her pelvis at time of sighting.   Assessment & Plan:  Normal IUD check. F/U 4 weeks AS for IUD check.

## 2019-04-11 ENCOUNTER — Ambulatory Visit: Payer: Medicaid Other | Admitting: Advanced Practice Midwife

## 2020-03-02 ENCOUNTER — Ambulatory Visit
Admission: EM | Admit: 2020-03-02 | Discharge: 2020-03-02 | Disposition: A | Discharge status: Home / Self Care | Payer: Medicaid Other | Attending: Emergency Medicine | Admitting: Emergency Medicine

## 2020-03-02 ENCOUNTER — Encounter: Payer: Self-pay | Admitting: Emergency Medicine

## 2020-03-02 ENCOUNTER — Other Ambulatory Visit: Payer: Self-pay

## 2020-03-02 DIAGNOSIS — W57XXXA Bitten or stung by nonvenomous insect and other nonvenomous arthropods, initial encounter: Secondary | ICD-10-CM

## 2020-03-02 DIAGNOSIS — S80862A Insect bite (nonvenomous), left lower leg, initial encounter: Secondary | ICD-10-CM | POA: Diagnosis not present

## 2020-03-02 MED ORDER — DOXYCYCLINE HYCLATE 100 MG PO CAPS: 100.0000 mg | ORAL_CAPSULE | Freq: Two times a day (BID) | ORAL | 0 refills | Status: DC

## 2020-03-02 MED ORDER — PREDNISONE 20 MG PO TABS: 20.0000 mg | ORAL_TABLET | Freq: Two times a day (BID) | ORAL | 0 refills | Status: AC

## 2020-03-02 MED ORDER — DEXAMETHASONE SODIUM PHOSPHATE 10 MG/ML IJ SOLN
10.0000 mg | Freq: Once | INTRAMUSCULAR | Status: AC
  Administered 2020-03-02: 10 mg via INTRAMUSCULAR

## 2020-03-02 NOTE — Discharge Instructions (Signed)
Steroid shot given in office Prednisone prescribed.  Take as directed and to completion Antibiotic prescribed for possible infection.  Begin taking in 1-2 days if not having improvement with steroid Continue with OTC medications like benadryl, claritin or zyrtec for itching, and ibuprofen/ tylenol for pain Follow up with PCP if symptoms persists Return or go to the ER if you have any new or worsening symptoms such as fever, chills, nausea, vomiting, redness, swelling, discharge, if symptoms do not improve with medications, etc..Marland Kitchen

## 2020-03-02 NOTE — ED Provider Notes (Signed)
[facility]   [id number] 03/02/20 Arrival Time: 1845  CC: Insect sting  SUBJECTIVE:  Julie Atkins is a 24 y.o. female who presents with a insect sting to LLE x 1 day.  Speculates she was stung by a yellow jacket.  Localizes the symptoms to LLE.  Describes it as red, warm, itchy, and mildly painful to the touch.  Has tried OTC with minimal relief.  Symptoms are made worse with scratching.  Reports similar symptoms in the past that improved with antibiotics.   Denies fever, chills, nausea, vomiting,  discharge, oral manifestations, SOB, chest pain, abdominal pain, changes in bowel or bladder function.    ROS: As per HPI.  All other pertinent ROS negative.     Past Medical History:  Diagnosis Date  . Abdominal pain    Past Surgical History:  Procedure Laterality Date  . TONSILLECTOMY    . TYMPANOTOMY  2004   No Known Allergies No current facility-administered medications on file prior to encounter.   Current Outpatient Medications on File Prior to Encounter  Medication Sig Dispense Refill  . Ferrous Sulfate (IRON PO) Take by mouth daily.     Social History   Socioeconomic History  . Marital status: Single    Spouse name: Not on file  . Number of children: 2  . Years of education: Not on file  . Highest education level: Not on file  Occupational History  . Not on file  Tobacco Use  . Smoking status: Never Smoker  . Smokeless tobacco: Never Used  Vaping Use  . Vaping Use: Never used  Substance and Sexual Activity  . Alcohol use: Not Currently  . Drug use: Not Currently  . Sexual activity: Not Currently    Birth control/protection: None  Other Topics Concern  . Not on file  Social History Narrative   9th grade   Social Determinants of Health   Financial Resource Strain:   . Difficulty of Paying Living Expenses: Not on file  Food Insecurity:   . Worried About Programme researcher, broadcasting/film/video in the Last Year: Not on file  . Ran Out of Food in the Last Year: Not  on file  Transportation Needs:   . Lack of Transportation (Medical): Not on file  . Lack of Transportation (Non-Medical): Not on file  Physical Activity:   . Days of Exercise per Week: Not on file  . Minutes of Exercise per Session: Not on file  Stress:   . Feeling of Stress : Not on file  Social Connections:   . Frequency of Communication with Friends and Family: Not on file  . Frequency of Social Gatherings with Friends and Family: Not on file  . Attends Religious Services: Not on file  . Active Member of Clubs or Organizations: Not on file  . Attends Banker Meetings: Not on file  . Marital Status: Not on file  Intimate Partner Violence:   . Fear of Current or Ex-Partner: Not on file  . Emotionally Abused: Not on file  . Physically Abused: Not on file  . Sexually Abused: Not on file   Family History  Problem Relation Age of Onset  . Hypertension Father     OBJECTIVE: Vitals:   03/02/20 1927  BP: (!) 146/94  Pulse: 81  Resp: 16  Temp: 98.6 F (37 C)  TempSrc: Oral  SpO2: 98%    General appearance: alert; no distress Head: NCAT Lungs: normal respiratory effort CV: Dorsalis pedis pulse 2+ Extremities: no  edema Skin: warm and dry; area of erythema to LLE apx 4 cm in diameter, mildly TTP, no obvious bleeding or drainage Psychological: alert and cooperative; normal mood and affect  ASSESSMENT & PLAN:  1. Insect bite of left lower leg, initial encounter     Meds ordered this encounter  Medications  . predniSONE (DELTASONE) 20 MG tablet    Sig: Take 1 tablet (20 mg total) by mouth 2 (two) times daily with a meal for 5 days.    Dispense:  10 tablet    Refill:  0    Order Specific Question:   Supervising Provider    Answer:   Eustace Moore [[id number]]  . doxycycline (VIBRAMYCIN) 100 MG capsule    Sig: Take 1 capsule (100 mg total) by mouth 2 (two) times daily.    Dispense:  20 capsule    Refill:  0    Order Specific Question:   Supervising  Provider    Answer:   Eustace Moore [[id number]]  . dexamethasone (DECADRON) injection 10 mg    Steroid shot given in office Prednisone prescribed.  Take as directed and to completion Antibiotic prescribed for possible infection.  Begin taking in 1-2 days if not having improvement with steroid Continue with OTC medications like benadryl, claritin or zyrtec for itching, and ibuprofen/ tylenol for pain Follow up with PCP if symptoms persists Return or go to the ER if you have any new or worsening symptoms such as fever, chills, nausea, vomiting, redness, swelling, discharge, if symptoms do not improve with medications, etc...  Reviewed expectations re: course of current medical issues. Questions answered. Outlined signs and symptoms indicating need for more acute intervention. Patient verbalized understanding. After Visit Summary given.   Rennis Harding, PA-C 03/02/20 1952

## 2020-03-02 NOTE — ED Triage Notes (Signed)
pt states she was stung by what she believes to be a yellow jacket yesterday. pt states her left lower leg is red, warm to the touch, and very painful. pt has only used ice and tylenol.

## 2020-04-17 ENCOUNTER — Other Ambulatory Visit: Payer: Self-pay

## 2020-04-17 ENCOUNTER — Encounter: Payer: Self-pay | Admitting: Emergency Medicine

## 2020-04-17 ENCOUNTER — Ambulatory Visit
Admission: EM | Admit: 2020-04-17 | Discharge: 2020-04-17 | Disposition: A | Discharge status: Home / Self Care | Payer: Medicaid Other | Attending: Emergency Medicine | Admitting: Emergency Medicine

## 2020-04-17 DIAGNOSIS — H66001 Acute suppurative otitis media without spontaneous rupture of ear drum, right ear: Secondary | ICD-10-CM

## 2020-04-17 DIAGNOSIS — R059 Cough, unspecified: Secondary | ICD-10-CM | POA: Diagnosis not present

## 2020-04-17 MED ORDER — BENZONATATE 100 MG PO CAPS: 100.0000 mg | ORAL_CAPSULE | Freq: Three times a day (TID) | ORAL | 0 refills | Status: DC

## 2020-04-17 MED ORDER — AMOXICILLIN 500 MG PO CAPS: 500.0000 mg | ORAL_CAPSULE | Freq: Two times a day (BID) | ORAL | 0 refills | Status: DC

## 2020-04-17 NOTE — ED Triage Notes (Addendum)
Symptoms started on Monday.  Shortness of breath, lost of taste and smell, nasal congestion, headaches, cough , chest is sore when she coughs .

## 2020-04-17 NOTE — ED Provider Notes (Signed)
[facility]   [id number] 04/17/20 Arrival Time: 1910   CC: COVID symptoms  SUBJECTIVE: History from: patient.  Julie Atkins is a 24 y.o. female who presents with SOB, lost of taste and smell, nasal congestion, headache, and cough x 4 days.  Denies sick exposure to COVID, flu or strep.  Denies alleviating or aggravating factors.  Denies previous covid infection.   Denies fever, sore throat, SOB, wheezing, chest pain, nausea, changes in bowel or bladder habits.    ROS: As per HPI.  All other pertinent ROS negative.     Past Medical History:  Diagnosis Date  . Abdominal pain    Past Surgical History:  Procedure Laterality Date  . TONSILLECTOMY    . TYMPANOTOMY  2004   No Known Allergies No current facility-administered medications on file prior to encounter.   Current Outpatient Medications on File Prior to Encounter  Medication Sig Dispense Refill  . Ferrous Sulfate (IRON PO) Take by mouth daily.     Social History   Socioeconomic History  . Marital status: Single    Spouse name: Not on file  . Number of children: 2  . Years of education: Not on file  . Highest education level: Not on file  Occupational History  . Not on file  Tobacco Use  . Smoking status: Never Smoker  . Smokeless tobacco: Never Used  Vaping Use  . Vaping Use: Never used  Substance and Sexual Activity  . Alcohol use: Not Currently  . Drug use: Not Currently  . Sexual activity: Not Currently    Birth control/protection: None  Other Topics Concern  . Not on file  Social History Narrative   9th grade   Social Determinants of Health   Financial Resource Strain:   . Difficulty of Paying Living Expenses: Not on file  Food Insecurity:   . Worried About Programme researcher, broadcasting/film/video in the Last Year: Not on file  . Ran Out of Food in the Last Year: Not on file  Transportation Needs:   . Lack of Transportation (Medical): Not on file  . Lack of Transportation (Non-Medical): Not on file    Physical Activity:   . Days of Exercise per Week: Not on file  . Minutes of Exercise per Session: Not on file  Stress:   . Feeling of Stress : Not on file  Social Connections:   . Frequency of Communication with Friends and Family: Not on file  . Frequency of Social Gatherings with Friends and Family: Not on file  . Attends Religious Services: Not on file  . Active Member of Clubs or Organizations: Not on file  . Attends Banker Meetings: Not on file  . Marital Status: Not on file  Intimate Partner Violence:   . Fear of Current or Ex-Partner: Not on file  . Emotionally Abused: Not on file  . Physically Abused: Not on file  . Sexually Abused: Not on file   Family History  Problem Relation Age of Onset  . Hypertension Father     OBJECTIVE:  Vitals:   04/17/20 1923 04/17/20 1925  BP: 116/75   Pulse: (!) 104   Resp: 19   Temp: 98.5 F (36.9 C)   TempSrc: Oral   SpO2: 97%   Weight:  240 lb (108.9 kg)  Height:  5\' 1"  (1.549 m)    General appearance: alert; mildly fatigued appearing, nontoxic; speaking in full sentences and tolerating own secretions HEENT: NCAT; Ears: EACs clear, LT TM  pearly gray, RT TM erythematous; Eyes: PERRL.  EOM grossly intact.Nose: nares patent without rhinorrhea, Throat: oropharynx clear, tonsils non erythematous or enlarged, uvula midline  Neck: supple without LAD Lungs: unlabored respirations, symmetrical air entry; cough: absent; no respiratory distress; CTAB Heart: regular rate and rhythm.  Skin: warm and dry Psychological: alert and cooperative; normal mood and affect   ASSESSMENT & PLAN:  1. Cough   2. Non-recurrent acute suppurative otitis media of right ear without spontaneous rupture of tympanic membrane     Meds ordered this encounter  Medications  . benzonatate (TESSALON) 100 MG capsule    Sig: Take 1 capsule (100 mg total) by mouth every 8 (eight) hours.    Dispense:  21 capsule    Refill:  0    Order Specific  Question:   Supervising Provider    Answer:   Eustace Moore [[id number]]  . amoxicillin (AMOXIL) 500 MG capsule    Sig: Take 1 capsule (500 mg total) by mouth 2 (two) times daily for 10 days.    Dispense:  20 capsule    Refill:  0    Order Specific Question:   Supervising Provider    Answer:   Eustace Moore [[id number]]   COVID testing ordered.  It will take between 5-7 days for test results.  Someone will contact you regarding abnormal results.    In the meantime: You should remain isolated in your home for 10 days from symptom onset AND greater than 72 hours after symptoms resolution (absence of fever without the use of fever-reducing medication and improvement in respiratory symptoms), whichever is longer Get plenty of rest and push fluids Tessalon Perles prescribed for cough Use OTC zyrtec for nasal congestion, runny nose, and/or sore throat Use OTC flonase for nasal congestion and runny nose Use medications daily for symptom relief Use OTC medications like ibuprofen or tylenol as needed fever or pain Call or go to the ED if you have any new or worsening symptoms such as fever, worsening cough, shortness of breath, chest tightness, chest pain, turning blue, changes in mental status, etc...   Amoxicillin for RT ear infection  Reviewed expectations re: course of current medical issues. Questions answered. Outlined signs and symptoms indicating need for more acute intervention. Patient verbalized understanding. After Visit Summary given.         Rennis Harding, PA-C 04/17/20 1947

## 2020-04-17 NOTE — Discharge Instructions (Signed)

## 2020-04-18 LAB — NOVEL CORONAVIRUS, NAA: SARS-CoV-2, NAA: DETECTED — AB

## 2020-04-18 LAB — SARS-COV-2, NAA 2 DAY TAT

## 2020-04-19 ENCOUNTER — Telehealth: Payer: Self-pay | Admitting: Emergency Medicine

## 2020-04-19 ENCOUNTER — Telehealth: Payer: Self-pay | Admitting: Nurse Practitioner

## 2020-04-19 MED ORDER — DOXYCYCLINE HYCLATE 100 MG PO CAPS: 100.0000 mg | ORAL_CAPSULE | Freq: Two times a day (BID) | ORAL | 0 refills | Status: DC

## 2020-04-19 NOTE — Telephone Encounter (Signed)
Reports persistent symptoms with ear pain despite antibiotic therapy.  States amoxicillin is not helping with symptoms.  Doxycycline sent to pharmacy on file.

## 2020-04-19 NOTE — Telephone Encounter (Signed)
Called to discuss symptoms of COVID-19 since patient was tested at a [facility] health facility recently.  Also called to discuss the option of monoclonal antibody infusion with the patient.  Patient does appear to qualify for monoclonal antibody infusion based on BMI of 45.35.  At this time the patient is not interested in this treatment option for COVID-19.  She reports that her worst symptom at this time is the ear infection which does not seem to be improving with current antibiotics.  Suggested patient reach out to urgent care for further evaluation of her ear infection.    MyChart message sent to patient with information about monoclonal antibody infusion and how to reach the hotline if she changes her mind.  Shawna Clamp, DNP, AGNP-c COVID-19 [facility] [phone]

## 2020-09-10 ENCOUNTER — Encounter: Payer: Self-pay | Admitting: Adult Health

## 2020-09-10 ENCOUNTER — Other Ambulatory Visit: Payer: Self-pay

## 2020-09-10 ENCOUNTER — Ambulatory Visit (INDEPENDENT_AMBULATORY_CARE_PROVIDER_SITE_OTHER): Payer: Medicaid Other | Admitting: Adult Health

## 2020-09-10 VITALS — BP 122/81 | HR 85 | Ht 61.0 in | Wt 225.8 lb

## 2020-09-10 DIAGNOSIS — Z319 Encounter for procreative management, unspecified: Secondary | ICD-10-CM | POA: Insufficient documentation

## 2020-09-10 DIAGNOSIS — Z30432 Encounter for removal of intrauterine contraceptive device: Secondary | ICD-10-CM | POA: Diagnosis not present

## 2020-09-10 NOTE — Progress Notes (Signed)
  Subjective:     Patient ID: Julie Atkins, female   DOB: 1996/01/08, 25 y.o.   MRN: [id number]  HPI [name] is a 25 year old white female, with DP, G2P2 in for IUD removal, she says it has caused headaches and she wants to get pregnant. Last pap 10/17/2018 was normal.  Review of Systems +headaches with IUD Reviewed past medical,surgical, social and family history. Reviewed medications and allergies.     Objective:   Physical Exam BP 122/81 (BP Location: Left Arm, Patient Position: Sitting, Cuff Size: Normal)   Pulse 85   Ht 5\' 1"  (1.549 m)   Wt 225 lb 12.8 oz (102.4 kg)   BMI 42.66 kg/m  Consent signed and time out called. Skin warm and dry.Pelvic: external genitalia is normal in appearance no lesions, vagina: pink with good moisture,urethra has no lesions or masses noted, cervix:smooth and bulbous,IUD strings seen and grasped with forceps and pt asked to cough and IUD easily removed. uterus: normal size, shape and contour, non tender, no masses felt, adnexa: no masses or tenderness noted. Bladder is non tender and no masses felt. Examination chaperoned by [facility] LPN  [facility] - 09/10/20 1533      Pregnancy Intention Screening   Does the patient want to become pregnant in the next year? Yes    Does the patient's partner want to become pregnant in the next year? Yes    Would the patient like to discuss contraceptive options today? No      Contraception Wrap Up   Current Method IUD or IUS    End Method Pregnant/Seeking Pregnancy    Contraception Counseling Provided Yes             Assessment:     1. Encounter for IUD removal  2. Patient desires pregnancy Start OTC PNV with folic acid Discussed timing of sex and could take 6-18 months of active trying to get pregnant     Plan:      Return in 1 year for pap and physical or sooner if pregnancy achieved.

## 2020-12-08 ENCOUNTER — Emergency Department (HOSPITAL_COMMUNITY)
Admission: EM | Admit: 2020-12-08 | Discharge: 2020-12-08 | Disposition: A | Discharge status: Home / Self Care | Payer: Medicaid Other | Attending: Emergency Medicine | Admitting: Emergency Medicine

## 2020-12-08 ENCOUNTER — Emergency Department (HOSPITAL_COMMUNITY): Payer: Medicaid Other

## 2020-12-08 ENCOUNTER — Other Ambulatory Visit: Payer: Self-pay

## 2020-12-08 ENCOUNTER — Encounter (HOSPITAL_COMMUNITY): Payer: Self-pay | Admitting: *Deleted

## 2020-12-08 DIAGNOSIS — R197 Diarrhea, unspecified: Secondary | ICD-10-CM | POA: Insufficient documentation

## 2020-12-08 DIAGNOSIS — R110 Nausea: Secondary | ICD-10-CM | POA: Insufficient documentation

## 2020-12-08 DIAGNOSIS — R109 Unspecified abdominal pain: Secondary | ICD-10-CM | POA: Diagnosis not present

## 2020-12-08 DIAGNOSIS — R1010 Upper abdominal pain, unspecified: Secondary | ICD-10-CM | POA: Insufficient documentation

## 2020-12-08 DIAGNOSIS — R1011 Right upper quadrant pain: Secondary | ICD-10-CM | POA: Diagnosis not present

## 2020-12-08 DIAGNOSIS — R1013 Epigastric pain: Secondary | ICD-10-CM | POA: Diagnosis not present

## 2020-12-08 LAB — URINALYSIS, ROUTINE W REFLEX MICROSCOPIC
Bilirubin Urine: NEGATIVE
Glucose, UA: NEGATIVE mg/dL
Hgb urine dipstick: NEGATIVE
Ketones, ur: NEGATIVE mg/dL
Nitrite: NEGATIVE
Protein, ur: NEGATIVE mg/dL
Specific Gravity, Urine: 1.012 (ref 1.005–1.030)
pH: 6 (ref 5.0–8.0)

## 2020-12-08 LAB — CBC WITH DIFFERENTIAL/PLATELET
Abs Immature Granulocytes: 0.07 10*3/uL (ref 0.00–0.07)
Basophils Absolute: 0.1 10*3/uL (ref 0.0–0.1)
Basophils Relative: 0 %
Eosinophils Absolute: 0.2 10*3/uL (ref 0.0–0.5)
Eosinophils Relative: 1 %
HCT: 42.3 % (ref 36.0–46.0)
Hemoglobin: 14.3 g/dL (ref 12.0–15.0)
Immature Granulocytes: 0 %
Lymphocytes Relative: 18 %
Lymphs Abs: 2.8 10*3/uL (ref 0.7–4.0)
MCH: 28.8 pg (ref 26.0–34.0)
MCHC: 33.8 g/dL (ref 30.0–36.0)
MCV: 85.1 fL (ref 80.0–100.0)
Monocytes Absolute: 1.1 10*3/uL — ABNORMAL HIGH (ref 0.1–1.0)
Monocytes Relative: 7 %
Neutro Abs: 11.3 10*3/uL — ABNORMAL HIGH (ref 1.7–7.7)
Neutrophils Relative %: 74 %
Platelets: 412 10*3/uL — ABNORMAL HIGH (ref 150–400)
RBC: 4.97 MIL/uL (ref 3.87–5.11)
RDW: 12.8 % (ref 11.5–15.5)
WBC: 15.7 10*3/uL — ABNORMAL HIGH (ref 4.0–10.5)
nRBC: 0 % (ref 0.0–0.2)

## 2020-12-08 LAB — COMPREHENSIVE METABOLIC PANEL
ALT: 15 U/L (ref 0–44)
AST: 17 U/L (ref 15–41)
Albumin: 4.6 g/dL (ref 3.5–5.0)
Alkaline Phosphatase: 87 U/L (ref 38–126)
Anion gap: 9 (ref 5–15)
BUN: 8 mg/dL (ref 6–20)
CO2: 25 mmol/L (ref 22–32)
Calcium: 9.1 mg/dL (ref 8.9–10.3)
Chloride: 101 mmol/L (ref 98–111)
Creatinine, Ser: 0.75 mg/dL (ref 0.44–1.00)
GFR, Estimated: >60 mL/min (ref >60)
Glucose, Bld: 97 mg/dL (ref 70–99)
Potassium: 3.7 mmol/L (ref 3.5–5.1)
Sodium: 135 mmol/L (ref 135–145)
Total Bilirubin: 0.8 mg/dL (ref 0.3–1.2)
Total Protein: 8.6 g/dL — ABNORMAL HIGH (ref 6.5–8.1)

## 2020-12-08 LAB — POC URINE PREG, ED: Preg Test, Ur: NEGATIVE

## 2020-12-08 LAB — LIPASE, BLOOD: Lipase: 31 U/L (ref 11–51)

## 2020-12-08 MED ORDER — AMOXICILLIN-POT CLAVULANATE 875-125 MG PO TABS
1.0000 | ORAL_TABLET | Freq: Once | ORAL | Status: AC
  Administered 2020-12-08: 1 via ORAL
  Filled 2020-12-08: qty 1

## 2020-12-08 MED ORDER — SODIUM CHLORIDE 0.9 % IV BOLUS
1000.0000 mL | Freq: Once | INTRAVENOUS | Status: AC
  Administered 2020-12-08: 1000 mL via INTRAVENOUS

## 2020-12-08 MED ORDER — FAMOTIDINE 20 MG PO TABS: 20.0000 mg | ORAL_TABLET | Freq: Every day | ORAL | 0 refills | Status: DC

## 2020-12-08 MED ORDER — ONDANSETRON 4 MG PO TBDP: 4.0000 mg | ORAL_TABLET | Freq: Three times a day (TID) | ORAL | 0 refills | Status: DC | PRN

## 2020-12-08 MED ORDER — DICYCLOMINE HCL 20 MG PO TABS: 20.0000 mg | ORAL_TABLET | Freq: Two times a day (BID) | ORAL | 0 refills | Status: DC

## 2020-12-08 MED ORDER — LIDOCAINE VISCOUS HCL 2 % MT SOLN
15.0000 mL | Freq: Once | OROMUCOSAL | Status: AC
  Administered 2020-12-08: 15 mL via ORAL
  Filled 2020-12-08: qty 15

## 2020-12-08 MED ORDER — ONDANSETRON HCL 4 MG/2ML IJ SOLN
4.0000 mg | Freq: Once | INTRAMUSCULAR | Status: AC
  Administered 2020-12-08: 4 mg via INTRAVENOUS
  Filled 2020-12-08: qty 2

## 2020-12-08 MED ORDER — IOHEXOL 300 MG/ML  SOLN
100.0000 mL | Freq: Once | INTRAMUSCULAR | Status: AC | PRN
  Administered 2020-12-08: 100 mL via INTRAVENOUS

## 2020-12-08 MED ORDER — FAMOTIDINE IN NACL 20-0.9 MG/50ML-% IV SOLN
20.0000 mg | Freq: Once | INTRAVENOUS | Status: AC
  Administered 2020-12-08: 20 mg via INTRAVENOUS
  Filled 2020-12-08: qty 50

## 2020-12-08 MED ORDER — SUCRALFATE 1 GM/10ML PO SUSP
1.0000 g | Freq: Three times a day (TID) | ORAL | Status: DC
  Administered 2020-12-08: 1 g via ORAL
  Filled 2020-12-08: qty 10

## 2020-12-08 MED ORDER — AMOXICILLIN-POT CLAVULANATE 875-125 MG PO TABS: 1.0000 | ORAL_TABLET | Freq: Two times a day (BID) | ORAL | 0 refills | Status: DC

## 2020-12-08 MED ORDER — DICYCLOMINE HCL 10 MG PO CAPS
20.0000 mg | ORAL_CAPSULE | Freq: Once | ORAL | Status: AC
  Administered 2020-12-08: 20 mg via ORAL
  Filled 2020-12-08: qty 2

## 2020-12-08 MED ORDER — ALUM & MAG HYDROXIDE-SIMETH 200-200-20 MG/5ML PO SUSP
30.0000 mL | Freq: Once | ORAL | Status: AC
  Administered 2020-12-08: 30 mL via ORAL
  Filled 2020-12-08: qty 30

## 2020-12-08 MED ORDER — MORPHINE SULFATE (PF) 4 MG/ML IV SOLN
4.0000 mg | Freq: Once | INTRAVENOUS | Status: AC
  Administered 2020-12-08: 4 mg via INTRAVENOUS
  Filled 2020-12-08: qty 1

## 2020-12-08 MED ORDER — FENTANYL CITRATE (PF) 100 MCG/2ML IJ SOLN
50.0000 ug | INTRAMUSCULAR | Status: DC | PRN
  Administered 2020-12-08: 50 ug via INTRAVENOUS
  Filled 2020-12-08: qty 2

## 2020-12-08 NOTE — ED Notes (Signed)
US bedside

## 2020-12-08 NOTE — ED Provider Notes (Signed)
Accepted handoff at shift change from Sam P PA-C. Please see prior provider note for more detail.   Briefly: Patient is 25 yo female  Right upper quadrant/entire upper upper abdominal pain since yesterday evening progressively worsening with some loose stool and some nausea no vomiting.  Plan: FU on CT  Physical Exam  BP (!) 95/58   Pulse 64   Temp 98.4 F (36.9 C) (Oral)   Resp 16   Ht 5\' 1"  (1.549 m)   Wt 99.8 kg   LMP 12/01/2020   SpO2 100%   BMI 41.57 kg/m   Physical Exam Vitals and nursing note reviewed.  Constitutional:      General: She is not in acute distress. HENT:     Head: Normocephalic and atraumatic.     Nose: Nose normal.  Eyes:     General: No scleral icterus. Cardiovascular:     Rate and Rhythm: Normal rate and regular rhythm.     Pulses: Normal pulses.     Heart sounds: Normal heart sounds.  Pulmonary:     Effort: Pulmonary effort is normal. No respiratory distress.     Breath sounds: No wheezing.  Abdominal:     Palpations: Abdomen is soft.     Tenderness: There is abdominal tenderness in the right upper quadrant. Negative signs include Murphy's sign, Rovsing's sign, McBurney's sign and psoas sign.  Musculoskeletal:     Cervical back: Normal range of motion.     Right lower leg: No edema.     Left lower leg: No edema.  Skin:    General: Skin is warm and dry.     Capillary Refill: Capillary refill takes less than 2 seconds.  Neurological:     Mental Status: She is alert. Mental status is at baseline.  Psychiatric:        Mood and Affect: Mood normal.        Behavior: Behavior normal.    ED Course/Procedures     Procedures Results for orders placed or performed during the hospital encounter of 12/08/20  Comprehensive metabolic panel  Result Value Ref Range   Sodium 135 135 - 145 mmol/L   Potassium 3.7 3.5 - 5.1 mmol/L   Chloride 101 98 - 111 mmol/L   CO2 25 22 - 32 mmol/L   Glucose, Bld 97 70 - 99 mg/dL   BUN 8 6 - 20 mg/dL    Creatinine, Ser 02/08/21 0.44 - 1.00 mg/dL   Calcium 9.1 8.9 - 0.16 mg/dL   Total Protein 8.6 (H) 6.5 - 8.1 g/dL   Albumin 4.6 3.5 - 5.0 g/dL   AST 17 15 - 41 U/L   ALT 15 0 - 44 U/L   Alkaline Phosphatase 87 38 - 126 U/L   Total Bilirubin 0.8 0.3 - 1.2 mg/dL   GFR, Estimated 55.3 >74 mL/min   Anion gap 9 5 - 15  CBC with Differential  Result Value Ref Range   WBC 15.7 (H) 4.0 - 10.5 K/uL   RBC 4.97 3.87 - 5.11 MIL/uL   Hemoglobin 14.3 12.0 - 15.0 g/dL   HCT >82 70.7 - 86.7 %   MCV 85.1 80.0 - 100.0 fL   MCH 28.8 26.0 - 34.0 pg   MCHC 33.8 30.0 - 36.0 g/dL   RDW 54.4 92.0 - 10.0 %   Platelets 412 (H) 150 - 400 K/uL   nRBC 0.0 0.0 - 0.2 %   Neutrophils Relative % 74 %   Neutro Abs 11.3 (H) 1.7 -  7.7 K/uL   Lymphocytes Relative 18 %   Lymphs Abs 2.8 0.7 - 4.0 K/uL   Monocytes Relative 7 %   Monocytes Absolute 1.1 (H) 0.1 - 1.0 K/uL   Eosinophils Relative 1 %   Eosinophils Absolute 0.2 0.0 - 0.5 K/uL   Basophils Relative 0 %   Basophils Absolute 0.1 0.0 - 0.1 K/uL   Immature Granulocytes 0 %   Abs Immature Granulocytes 0.07 0.00 - 0.07 K/uL  Lipase, blood  Result Value Ref Range   Lipase 31 11 - 51 U/L  Urinalysis, Routine w reflex microscopic Urine, Clean Catch  Result Value Ref Range   Color, Urine YELLOW YELLOW   APPearance HAZY (A) CLEAR   Specific Gravity, Urine 1.012 1.005 - 1.030   pH 6.0 5.0 - 8.0   Glucose, UA NEGATIVE NEGATIVE mg/dL   Hgb urine dipstick NEGATIVE NEGATIVE   Bilirubin Urine NEGATIVE NEGATIVE   Ketones, ur NEGATIVE NEGATIVE mg/dL   Protein, ur NEGATIVE NEGATIVE mg/dL   Nitrite NEGATIVE NEGATIVE   Leukocytes,Ua MODERATE (A) NEGATIVE   RBC / HPF 0-5 0 - 5 RBC/hpf   WBC, UA 6-10 0 - 5 WBC/hpf   Bacteria, UA RARE (A) NONE SEEN   Squamous Epithelial / LPF 6-10 0 - 5   Mucus PRESENT   POC urine preg, ED  Result Value Ref Range   Preg Test, Ur NEGATIVE NEGATIVE   CT Abdomen Pelvis W Contrast  Result Date: 12/08/2020 CLINICAL DATA:  Abdominal pain  with nausea and diarrhea EXAM: CT ABDOMEN AND PELVIS WITH CONTRAST TECHNIQUE: Multidetector CT imaging of the abdomen and pelvis was performed using the standard protocol following bolus administration of intravenous contrast. CONTRAST:  OMNIPAQUE IOHEXOL 300 MG/ML  SOLN COMPARISON:  None. FINDINGS: LOWER CHEST: Normal. HEPATOBILIARY: Normal hepatic contours. No intra- or extrahepatic biliary dilatation. The gallbladder is normal. PANCREAS: Normal pancreas. No ductal dilatation or peripancreatic fluid collection. SPLEEN: Normal. ADRENALS/URINARY TRACT: The adrenal glands are normal. No hydronephrosis, nephroureterolithiasis or solid renal mass. The urinary bladder is normal for degree of distention STOMACH/BOWEL: There is no hiatal hernia. Normal duodenal course and caliber. No small bowel dilatation or inflammation. No focal colonic abnormality. Appendiceal diameter is upper limits of normal. There is minimal adjacent fat stranding (series 2, image 49 and series 5, image 50). VASCULAR/LYMPHATIC: Normal course and caliber of the major abdominal vessels. No abdominal or pelvic lymphadenopathy. REPRODUCTIVE: Normal uterus. No adnexal mass. MUSCULOSKELETAL. No bony spinal canal stenosis or focal osseous abnormality. OTHER: None. IMPRESSION: 1. Upper limits of normal appendix with minimal adjacent fat stranding. This may indicate early acute appendicitis. 2. No other acute abnormality of the abdomen or pelvis. Electronically Signed   By: Deatra Robinson M.D.   On: 12/08/2020 20:09   US Abdomen Limited RUQ (LIVER/GB)  Result Date: 12/08/2020 CLINICAL DATA:  Epigastric pain with nausea for 1 day EXAM: ULTRASOUND ABDOMEN LIMITED RIGHT UPPER QUADRANT COMPARISON:  None. FINDINGS: Gallbladder: No gallstones or wall thickening visualized. No sonographic Murphy sign noted by sonographer. Common bile duct: Diameter: 3 mm Liver: Mildly increased liver echogenicity, with preservation of the portal triads, this appearance is  likely artifactual/due to technique. Portal vein is patent on color Doppler imaging with normal direction of blood flow towards the liver. Other: None. IMPRESSION: No evidence of cholecystitis or biliary obstruction. Electronically Signed   By: Caprice Renshaw   On: 12/08/2020 17:28    MDM   Patient is with upper quadrant abdominal pain, mild leukocytosis no  lower abdominal pain or tenderness.  Otherwise reassuring work-up.  Overall ultrasound is without any acute abnormalities prior provider provide patient with analgesia and she seems much improved from a pain standpoint at this moment.  I reviewed CT scan which shows no acute abnormalities apart from some upper limits of normal appendix with minimal fat stranding.  9:30 PM discussed with Dr. Franky Macho who will review CT scan  Dr. Lovell Sheehan of general surgery called back to discuss results of CT scan.  Per Dr. Lovell Sheehan this is a relatively soft call for an acute appendicitis.  He states based off the history that I provided him he does not think that appendicitis is very likely.  I certainly agree with this assessment.  Discussed briefly with attending physician prior to discharge.  Patient states she prefer to be discharged home at this time.  Will discharge home with Augmentin as well as Bentyl Zofran and Pepcid.  She will follow-up with Dr. Lovell Sheehan and in 24-48 hours, return precautions were given and she understands should her symptoms worsen she will return immediately to the ER for evaluation.      Solon Augusta [place], [place] 12/08/20 2332    Eber Hong, MD 12/10/20 1257

## 2020-12-08 NOTE — Discharge Instructions (Addendum)
Please monitor your symptoms closely over the next 24 hours.  If you begin to have fevers, worsening pain, pain that moves to your right lower quadrant of your abdomen or any other new or concerning symptoms please return to the ER for reevaluation.  Ultimately if your symptoms remain as mild as they are currently or improve please take the antibiotics for the entire course but you may not need surgery.  You may follow-up with Dr. Lovell Sheehan whose information I have provided you with.  For pain I have provided you with Bentyl to take twice daily.  Please see Pepcid once per night.  Please use Zofran for as needed for nausea or vomiting.

## 2020-12-08 NOTE — ED Notes (Signed)
ED Provider at bedside. 

## 2020-12-08 NOTE — ED Triage Notes (Signed)
Abdominal pain onset last night  

## 2020-12-08 NOTE — ED Notes (Signed)
Patient transported to CT 

## 2020-12-08 NOTE — ED Provider Notes (Signed)
[facility] EMERGENCY DEPARTMENT Provider Note   CSN: [id number] Arrival date & time: 12/08/20  1612     History Chief Complaint  Patient presents with   Abdominal Pain    Jilliam Bellmore is a 25 y.o. female without significant past medical history who presents to the emergency department with complaints of abdominal pain that began last night.  Pain is to the upper abdomen, feels like a tightness, it is constant and feels like it is progressively worsening.  She has had some associated nausea without vomiting and did have 1 loose stool today.  She had significant worsening in pain when she had cheetos earlier today.  She denies fever, vomiting, melena, hematochezia, vaginal bleeding, vaginal discharge, dysuria, chest pain, or shortness of breath.  She has not had prior abdominal surgeries.  HPI     Past Medical History:  Diagnosis Date   Abdominal pain     Patient Active Problem List   Diagnosis Date Noted   Patient desires pregnancy 09/10/2020   Encounter for IUD removal 09/10/2020    Past Surgical History:  Procedure Laterality Date   TONSILLECTOMY     TYMPANOTOMY  2004     OB History     Gravida  2   Para  2   Term  2   Preterm      AB      Living  2      SAB      IAB      Ectopic      Multiple  0   Live Births  2           Family History  Problem Relation Age of Onset   Hypertension Father     Social History   Tobacco Use   Smoking status: Never   Smokeless tobacco: Never  Vaping Use   Vaping Use: Never used  Substance Use Topics   Alcohol use: Not Currently   Drug use: Not Currently    Home Medications Prior to Admission medications   Not on File    Allergies    Patient has no known allergies.  Review of Systems   Review of Systems  Constitutional:  Negative for chills and fever.  Respiratory:  Negative for shortness of breath.   Cardiovascular:  Negative for chest pain.  Gastrointestinal:  Positive for abdominal  pain, diarrhea and nausea. Negative for blood in stool, constipation and vomiting.  Genitourinary:  Negative for dysuria, vaginal bleeding and vaginal discharge.  Neurological:  Negative for syncope.  All other systems reviewed and are negative.  Physical Exam Updated Vital Signs BP (!) 102/56 (BP Location: Left Arm)   Pulse (!) 54   Temp 98.2 F (36.8 C) (Oral)   Resp 18   Ht 5\' 1"  (1.549 m)   Wt 99.8 kg   LMP 12/01/2020   SpO2 100%   BMI 41.57 kg/m   Physical Exam Vitals and nursing note reviewed.  Constitutional:      General: She is not in acute distress.    Appearance: She is well-developed. She is not toxic-appearing.  HENT:     Head: Normocephalic and atraumatic.  Eyes:     General:        Right eye: No discharge.        Left eye: No discharge.     Conjunctiva/sclera: Conjunctivae normal.  Cardiovascular:     Rate and Rhythm: Normal rate and regular rhythm.  Pulmonary:     Effort: Pulmonary effort  is normal. No respiratory distress.     Breath sounds: Normal breath sounds. No wheezing, rhonchi or rales.  Abdominal:     General: There is no distension.     Palpations: Abdomen is soft.     Tenderness: There is abdominal tenderness in the right upper quadrant, epigastric area and left upper quadrant. There is no right CVA tenderness, left CVA tenderness, guarding or rebound. Negative signs include Murphy's sign and McBurney's sign.  Musculoskeletal:     Cervical back: Neck supple.  Skin:    General: Skin is warm and dry.     Findings: No rash.  Neurological:     Mental Status: She is alert.     Comments: Clear speech.   Psychiatric:        Behavior: Behavior normal.    ED Results / Procedures / Treatments   Labs (all labs ordered are listed, but only abnormal results are displayed) Labs Reviewed  COMPREHENSIVE METABOLIC PANEL - Abnormal; Notable for the following components:      Result Value   Total Protein 8.6 (*)    All other components within normal  limits  CBC WITH DIFFERENTIAL/PLATELET - Abnormal; Notable for the following components:   WBC 15.7 (*)    Platelets 412 (*)    Neutro Abs 11.3 (*)    Monocytes Absolute 1.1 (*)    All other components within normal limits  URINALYSIS, ROUTINE W REFLEX MICROSCOPIC - Abnormal; Notable for the following components:   APPearance HAZY (*)    Leukocytes,Ua MODERATE (*)    Bacteria, UA RARE (*)    All other components within normal limits  LIPASE, BLOOD  POC URINE PREG, ED    EKG None  Radiology US Abdomen Limited RUQ (LIVER/GB)  Result Date: 12/08/2020 CLINICAL DATA:  Epigastric pain with nausea for 1 day EXAM: ULTRASOUND ABDOMEN LIMITED RIGHT UPPER QUADRANT COMPARISON:  None. FINDINGS: Gallbladder: No gallstones or wall thickening visualized. No sonographic Murphy sign noted by sonographer. Common bile duct: Diameter: 3 mm Liver: Mildly increased liver echogenicity, with preservation of the portal triads, this appearance is likely artifactual/due to technique. Portal vein is patent on color Doppler imaging with normal direction of blood flow towards the liver. Other: None. IMPRESSION: No evidence of cholecystitis or biliary obstruction. Electronically Signed   By: Caprice Renshaw   On: 12/08/2020 17:28    Procedures Procedures   Medications Ordered in ED Medications  sucralfate (CARAFATE) 1 GM/10ML suspension 1 g (1 g Oral Given 12/08/20 1820)  morphine 4 MG/ML injection 4 mg (4 mg Intravenous Given 12/08/20 1656)  ondansetron (ZOFRAN) injection 4 mg (4 mg Intravenous Given 12/08/20 1655)  famotidine (PEPCID) IVPB 20 mg premix (0 mg Intravenous Stopped 12/08/20 1750)  sodium chloride 0.9 % bolus 1,000 mL (0 mLs Intravenous Stopped 12/08/20 1750)  alum & mag hydroxide-simeth (MAALOX/MYLANTA) 200-200-20 MG/5ML suspension 30 mL (30 mLs Oral Given 12/08/20 1749)    And  lidocaine (XYLOCAINE) 2 % viscous mouth solution 15 mL (15 mLs Oral Given 12/08/20 1749)  dicyclomine (BENTYL) capsule 20 mg (20 mg Oral  Given 12/08/20 1819)    ED Course  I have reviewed the triage vital signs and the nursing notes.  Pertinent labs & imaging results that were available during my care of the patient were reviewed by me and considered in my medical decision making (see chart for details).    MDM Rules/Calculators/A&P  Patient presents to the ED with complaints of abdominal pain. Patient nontoxic appearing, in no apparent distress, vitals without significant abnormality. On exam patient tender to the upper abdomen, no peritoneal signs. Will evaluate with labs and RUQ [place]. Analgesics, anti-emetics, and fluids administered.   Additional history obtained:  Additional history obtained from chart review & nursing note review.   Lab Tests:  I Ordered, reviewed, and interpreted labs, which included:  CBC: Leukocytosis. CMP: Fairly unremarkable Lipase: Within normal limits UA: Moderate leukocytes, nitrate negative, no urinary symptoms Preg test: Negative  Imaging Studies ordered:  I ordered imaging studies which included right upper quadrant ultrasound, I independently reviewed, formal radiology impression shows: No evidence of cholecystitis or biliary obstruction.  ED Course:  Initially patient had some mild improvement status post Pepcid, morphine, Zofran, and GI cocktail, states the pain is coming back.  Will trial Bentyl and Carafate.    18:50: RE-EVAL: Patient tearful, states pain is returning & worsened again. Will obtain CT A/P at this time, additional analgesics ordered.   19:00: Patient care signed out to [facility] PA-C at change of shift pending CT A/P & disposition.   Portions of this note were generated with Scientist, clinical (histocompatibility and immunogenetics). Dictation errors may occur despite best attempts at proofreading.   Final Clinical Impression(s) / ED Diagnoses Final diagnoses:  Abdominal pain    Rx / DC Orders ED Discharge Orders     None        Desmond Lope 12/08/20 Lynford Citizen, MD 12/10/20 1257

## 2020-12-09 ENCOUNTER — Telehealth: Payer: Self-pay | Admitting: *Deleted

## 2020-12-09 ENCOUNTER — Observation Stay (HOSPITAL_COMMUNITY)
Admission: EM | Admit: 2020-12-09 | Discharge: 2020-12-10 | Disposition: A | Discharge status: Home / Self Care | Payer: Medicaid Other | Attending: General Surgery | Admitting: General Surgery

## 2020-12-09 ENCOUNTER — Emergency Department (HOSPITAL_COMMUNITY): Payer: Medicaid Other

## 2020-12-09 ENCOUNTER — Other Ambulatory Visit: Payer: Self-pay

## 2020-12-09 DIAGNOSIS — R1031 Right lower quadrant pain: Principal | ICD-10-CM

## 2020-12-09 DIAGNOSIS — U071 COVID-19: Secondary | ICD-10-CM | POA: Diagnosis not present

## 2020-12-09 DIAGNOSIS — R102 Pelvic and perineal pain: Secondary | ICD-10-CM

## 2020-12-09 LAB — CBC
HCT: 41.9 % (ref 36.0–46.0)
Hemoglobin: 13.5 g/dL (ref 12.0–15.0)
MCH: 28.1 pg (ref 26.0–34.0)
MCHC: 32.2 g/dL (ref 30.0–36.0)
MCV: 87.1 fL (ref 80.0–100.0)
Platelets: 385 10*3/uL (ref 150–400)
RBC: 4.81 MIL/uL (ref 3.87–5.11)
RDW: 12.8 % (ref 11.5–15.5)
WBC: 7.4 10*3/uL (ref 4.0–10.5)
nRBC: 0 % (ref 0.0–0.2)

## 2020-12-09 LAB — COMPREHENSIVE METABOLIC PANEL
ALT: 13 U/L (ref 0–44)
AST: 15 U/L (ref 15–41)
Albumin: 4.1 g/dL (ref 3.5–5.0)
Alkaline Phosphatase: 80 U/L (ref 38–126)
Anion gap: 6 (ref 5–15)
BUN: 8 mg/dL (ref 6–20)
CO2: 27 mmol/L (ref 22–32)
Calcium: 8.9 mg/dL (ref 8.9–10.3)
Chloride: 104 mmol/L (ref 98–111)
Creatinine, Ser: 0.76 mg/dL (ref 0.44–1.00)
GFR, Estimated: >60 mL/min (ref >60)
Glucose, Bld: 95 mg/dL (ref 70–99)
Potassium: 4.3 mmol/L (ref 3.5–5.1)
Sodium: 137 mmol/L (ref 135–145)
Total Bilirubin: 0.9 mg/dL (ref 0.3–1.2)
Total Protein: 7.8 g/dL (ref 6.5–8.1)

## 2020-12-09 LAB — LIPASE, BLOOD: Lipase: 33 U/L (ref 11–51)

## 2020-12-09 LAB — RESP PANEL BY RT-PCR (FLU A&B, COVID) ARPGX2
Influenza A by PCR: NEGATIVE
Influenza B by PCR: NEGATIVE
SARS Coronavirus 2 by RT PCR: POSITIVE — AB

## 2020-12-09 MED ORDER — HYDROMORPHONE HCL 1 MG/ML IJ SOLN: 1.0000 mg | INTRAMUSCULAR | Status: DC | PRN

## 2020-12-09 MED ORDER — HYDROCODONE-ACETAMINOPHEN 5-325 MG PO TABS: 1.0000 | ORAL_TABLET | ORAL | Status: DC | PRN

## 2020-12-09 MED ORDER — PIPERACILLIN-TAZOBACTAM 3.375 G IVPB 30 MIN
3.3750 g | Freq: Once | INTRAVENOUS | Status: DC
  Administered 2020-12-09: 3.375 g via INTRAVENOUS
  Filled 2020-12-09: qty 50

## 2020-12-09 MED ORDER — ONDANSETRON 4 MG PO TBDP: 4.0000 mg | ORAL_TABLET | Freq: Four times a day (QID) | ORAL | Status: DC | PRN

## 2020-12-09 MED ORDER — KCL IN DEXTROSE-NACL 20-5-0.45 MEQ/L-%-% IV SOLN
INTRAVENOUS | Status: DC
  Filled 2020-12-09 (×8): qty 1000

## 2020-12-09 MED ORDER — ONDANSETRON HCL 4 MG/2ML IJ SOLN: 4.0000 mg | Freq: Four times a day (QID) | INTRAMUSCULAR | Status: DC | PRN

## 2020-12-09 MED ORDER — PIPERACILLIN-TAZOBACTAM 3.375 G IVPB
3.3750 g | Freq: Three times a day (TID) | INTRAVENOUS | Status: DC
  Administered 2020-12-10: 3.375 g via INTRAVENOUS
  Filled 2020-12-09: qty 50

## 2020-12-09 MED ORDER — PIPERACILLIN-TAZOBACTAM 3.375 G IVPB: 3.3750 g | Freq: Three times a day (TID) | INTRAVENOUS | Status: DC

## 2020-12-09 MED ORDER — ACETAMINOPHEN 650 MG RE SUPP: 650.0000 mg | Freq: Four times a day (QID) | RECTAL | Status: DC | PRN

## 2020-12-09 MED ORDER — ACETAMINOPHEN 325 MG PO TABS: 650.0000 mg | ORAL_TABLET | Freq: Four times a day (QID) | ORAL | Status: DC | PRN

## 2020-12-09 MED ORDER — SIMETHICONE 80 MG PO CHEW: 40.0000 mg | CHEWABLE_TABLET | Freq: Four times a day (QID) | ORAL | Status: DC | PRN

## 2020-12-09 NOTE — ED Triage Notes (Signed)
Pt. States they were seen yesterday for abdominal pain. Pt. States their pain has changed since last night when they were seen. Pt. States the pain in the abdomen all across the middle of stomach and the bottom right of stomach as well.

## 2020-12-09 NOTE — ED Provider Notes (Signed)
[facility] EMERGENCY DEPARTMENT Provider Note   CSN: [id number] Arrival date & time: 12/09/20  1654     History Chief Complaint  Patient presents with   Abdominal Pain    Julie Atkins is a 25 y.o. female.  The history is provided by the patient and medical records. No language interpreter was used.  Abdominal Pain  25 year old female who presents for evaluation abdominal pain patient report for the past 2 days she has had abdominal pain.  Initially pain spread across her upper abdomen with some associated nausea and some loose stools but no vomiting.  Pain described as tightness discomfort sensation, mild to moderate in severity.  Become progressively worse.  No associated fever or chills no chest pain shortness of breath productive cough no dysuria vaginal bleeding vaginal discharge or change in appetite.  She was seen in the ED yesterday for her complaint.  An abdominal ultrasound performed at that time was unremarkable.  She did receive an abdominal pelvis CT scan which shows upper limit of a normal appendix with minimal adjacent fat stranding.  This may indicate early acute appendicitis.  Provider did discuss with on-call surgeon, Dr. Lovell Sheehan who felt this is likely unlikely to be acute appendicitis.  Patient was subsequently discharged home with Augmentin as well as Bentyl and Zofran and Pepcid.  Patient reports today her pain is no longer in her upper abdomen but has since affecting her lower abdomen and specifically her right lower quadrant.  Pain has been waxing waning at this time mild to moderate in severity but at times can be intense.  She was told that if the pain worsened to return therefore she decided to return to the ER.  Past Medical History:  Diagnosis Date   Abdominal pain     Patient Active Problem List   Diagnosis Date Noted   Patient desires pregnancy 09/10/2020   Encounter for IUD removal 09/10/2020    Past Surgical History:  Procedure Laterality Date    TONSILLECTOMY     TYMPANOTOMY  2004     OB History     Gravida  2   Para  2   Term  2   Preterm      AB      Living  2      SAB      IAB      Ectopic      Multiple  0   Live Births  2           Family History  Problem Relation Age of Onset   Hypertension Father     Social History   Tobacco Use   Smoking status: Never   Smokeless tobacco: Never  Vaping Use   Vaping Use: Never used  Substance Use Topics   Alcohol use: Not Currently   Drug use: Not Currently    Home Medications Prior to Admission medications   Medication Sig Start Date End Date Taking? Authorizing Provider  amoxicillin-clavulanate (AUGMENTIN) 875-125 MG tablet Take 1 tablet by mouth every 12 (twelve) hours. 12/08/20   Gailen Shelter, PA  dicyclomine (BENTYL) 20 MG tablet Take 1 tablet (20 mg total) by mouth 2 (two) times daily. 12/08/20   Gailen Shelter, PA  famotidine (PEPCID) 20 MG tablet Take 1 tablet (20 mg total) by mouth at bedtime. 12/08/20   Fondaw, Rodrigo Ran, PA  ondansetron (ZOFRAN ODT) 4 MG disintegrating tablet Take 1 tablet (4 mg total) by mouth every 8 (eight) hours as needed  for nausea or vomiting. 12/08/20   Gailen Shelter, PA    Allergies    Patient has no known allergies.  Review of Systems   Review of Systems  Gastrointestinal:  Positive for abdominal pain.  All other systems reviewed and are negative.  Physical Exam Updated Vital Signs BP 117/76 (BP Location: Right Arm)   Pulse 84   Temp 98.3 F (36.8 C) (Oral)   Resp 17   Ht 5\' 1"  (1.549 m)   Wt 99.8 kg   LMP 12/01/2020   SpO2 100%   BMI 41.57 kg/m   Physical Exam Vitals and nursing note reviewed.  Constitutional:      General: She is not in acute distress.    Appearance: She is well-developed. She is obese.  HENT:     Head: Atraumatic.  Eyes:     Conjunctiva/sclera: Conjunctivae normal.  Cardiovascular:     Rate and Rhythm: Normal rate and regular rhythm.  Pulmonary:     Effort: Pulmonary  effort is normal.  Abdominal:     General: Abdomen is flat.     Palpations: Abdomen is soft.     Tenderness: There is abdominal tenderness in the right lower quadrant. There is no right CVA tenderness, left CVA tenderness, guarding or rebound. Negative signs include Murphy's sign.  Musculoskeletal:     Cervical back: Neck supple.  Skin:    Findings: No rash.  Neurological:     Mental Status: She is alert. Mental status is at baseline.  Psychiatric:        Mood and Affect: Mood normal.    ED Results / Procedures / Treatments   Labs (all labs ordered are listed, but only abnormal results are displayed) Labs Reviewed  RESP PANEL BY RT-PCR (FLU A&B, COVID) ARPGX2  LIPASE, BLOOD  COMPREHENSIVE METABOLIC PANEL  CBC  URINALYSIS, ROUTINE W REFLEX MICROSCOPIC  POC URINE PREG, ED    EKG None  Radiology CT Abdomen Pelvis W Contrast  Result Date: 12/08/2020 CLINICAL DATA:  Abdominal pain with nausea and diarrhea EXAM: CT ABDOMEN AND PELVIS WITH CONTRAST TECHNIQUE: Multidetector CT imaging of the abdomen and pelvis was performed using the standard protocol following bolus administration of intravenous contrast. CONTRAST:  02/08/2021 OMNIPAQUE IOHEXOL 300 MG/ML  SOLN COMPARISON:  None. FINDINGS: LOWER CHEST: Normal. HEPATOBILIARY: Normal hepatic contours. No intra- or extrahepatic biliary dilatation. The gallbladder is normal. PANCREAS: Normal pancreas. No ductal dilatation or peripancreatic fluid collection. SPLEEN: Normal. ADRENALS/URINARY TRACT: The adrenal glands are normal. No hydronephrosis, nephroureterolithiasis or solid renal mass. The urinary bladder is normal for degree of distention STOMACH/BOWEL: There is no hiatal hernia. Normal duodenal course and caliber. No small bowel dilatation or inflammation. No focal colonic abnormality. Appendiceal diameter is upper limits of normal. There is minimal adjacent fat stranding (series 2, image 49 and series 5, image 50). VASCULAR/LYMPHATIC: Normal  course and caliber of the major abdominal vessels. No abdominal or pelvic lymphadenopathy. REPRODUCTIVE: Normal uterus. No adnexal mass. MUSCULOSKELETAL. No bony spinal canal stenosis or focal osseous abnormality. OTHER: None. IMPRESSION: 1. Upper limits of normal appendix with minimal adjacent fat stranding. This may indicate early acute appendicitis. 2. No other acute abnormality of the abdomen or pelvis. Electronically Signed   By: M.D.   On: 12/08/2020 20:09   02/08/2021 Abdomen Limited RUQ (LIVER/GB)  Result Date: 12/08/2020 CLINICAL DATA:  Epigastric pain with nausea for 1 day EXAM: ULTRASOUND ABDOMEN LIMITED RIGHT UPPER QUADRANT COMPARISON:  None. FINDINGS: Gallbladder: No gallstones or  wall thickening visualized. No sonographic Murphy sign noted by sonographer. Common bile duct: Diameter: 3 mm Liver: Mildly increased liver echogenicity, with preservation of the portal triads, this appearance is likely artifactual/due to technique. Portal vein is patent on color Doppler imaging with normal direction of blood flow towards the liver. Other: None. IMPRESSION: No evidence of cholecystitis or biliary obstruction. Electronically Signed   By: Caprice Renshaw   On: 12/08/2020 17:28    Procedures Procedures   Medications Ordered in ED Medications  piperacillin-tazobactam (ZOSYN) IVPB 3.375 g (has no administration in time range)    ED Course  I have reviewed the triage vital signs and the nursing notes.  Pertinent labs & imaging results that were available during my care of the patient were reviewed by me and considered in my medical decision making (see chart for details).    MDM Rules/Calculators/A&P                          BP 117/76 (BP Location: Right Arm)   Pulse 84   Temp 98.3 F (36.8 C) (Oral)   Resp 17   Ht 5\' 1"  (1.549 m)   Wt 99.8 kg   LMP 12/01/2020   SpO2 100%   BMI 41.57 kg/m   Final Clinical Impression(s) / ED Diagnoses Final diagnoses:  Abdominal pain, RLQ (right  lower quadrant)    Rx / DC Orders ED Discharge Orders     None      7:15 PM Patient who has had upper abdominal pain 2 days ago was seen yesterday for complaint had a CT scan that shows upper limit of a normal appendix possibly early acute appendicitis.  Discharged home with antibiotic and symptomatic treatment but returned today as the pain is now localized to her right lower quadrant.  She is overall well-appearing, mild tenderness to the right lower abdomen on exam without guarding or rebound tenderness.  She is afebrile, vital signs stable, normal WBC.  7:49 PM Given the fact that patient has more localized pain.  I have reached out and discussed with on-call surgeon Dr. 12/03/2020 who recommend to have patient admitted by hospitalist, initiate Zofran antibiotic and, he will see the patient tomorrow and determine disposition.  Recommend pelvic ultrasound to rule out GU pathology as well.  8:49 PM General Surgeon Dr. Lovell Sheehan have seen and evaluated patient and will admit to his floor for further care.  Pt is made aware of plan.    Lovell Sheehan, PA-C 12/09/20 2050    02/09/21, MD 12/10/20 1257

## 2020-12-09 NOTE — Telephone Encounter (Signed)
Transition Care Management Follow-up Telephone Call Date of discharge and from where: 12/08/2020 - [facility] How have you been since you were released from the hospital? "My stomach is still hurting" Any questions or concerns? No  Items Reviewed: Did the pt receive and understand the discharge instructions provided? Yes  Medications obtained and verified? Yes  Other? No  Any new allergies since your discharge? No  Dietary orders reviewed? No Do you have support at home? Yes    Functional Questionnaire: (I = Independent and D = Dependent) ADLs: I  Bathing/Dressing- I  Meal Prep- I  Eating- I  Maintaining continence- I  Transferring/Ambulation- I  Managing Meds- I  Follow up appointments reviewed:  PCP Hospital f/u appt confirmed? No   Specialist Hospital f/u appt confirmed? No   Are transportation arrangements needed? No  If their condition worsens, is the pt aware to call PCP or go to the Emergency Dept.? Yes Was the patient provided with contact information for the PCP's office or ED? Yes Was to pt encouraged to call back with questions or concerns? Yes

## 2020-12-09 NOTE — H&P (Signed)
Julie Atkins is an 25 y.o. female.   Chief Complaint: Lower abdominal pain HPI: Patient is a 25 year old white female who was seen in the emergency room yesterday with a less than 24-hour history of worsening upper abdominal pain.  She underwent a CT scanning of the abdomen which incidentally were revealed possible early appendicitis.  She was nontender in the right lower quadrant to examination.  It was elected to proceed with starting her on antibiotics and watching her from home over the next 24 hours.  She presented back to the emergency room this evening saying that her pain seems to be more periumbilical to suprapubic region.  She occasionally has right lower quadrant abdominal pain.  She did not take the antibiotics as prescribed.  She denies any nausea, vomiting, or diarrhea.  She last had a bowel movement yesterday.  Her last menstrual period was last week.  She denies any dysuria.  Past Medical History:  Diagnosis Date   Abdominal pain     Past Surgical History:  Procedure Laterality Date   TONSILLECTOMY     TYMPANOTOMY  2004    Family History  Problem Relation Age of Onset   Hypertension Father    Social History:  reports that she has never smoked. She has never used smokeless tobacco. She reports previous alcohol use. She reports previous drug use.  Allergies: No Known Allergies  (Not in a hospital admission)   Results for orders placed or performed during the hospital encounter of 12/09/20 (from the past 48 hour(s))  Lipase, blood     Status: None   Collection Time: 12/09/20  6:40 PM  Result Value Ref Range   Lipase 33 11 - 51 U/L    Comment: Performed at [facility], [address] [place], [place] [address]  Comprehensive metabolic panel     Status: None   Collection Time: 12/09/20  6:40 PM  Result Value Ref Range   Sodium 137 135 - 145 mmol/L   Potassium 4.3 3.5 - 5.1 mmol/L   Chloride 104 98 - 111 mmol/L   CO2 27 22 - 32 mmol/L   Glucose, Bld 95 70 - 99  mg/dL    Comment: Glucose reference range applies only to samples taken after fasting for at least 8 hours.   BUN 8 6 - 20 mg/dL   Creatinine, Ser 5.46 0.44 - 1.00 mg/dL   Calcium 8.9 8.9 - 27.0 mg/dL   Total Protein 7.8 6.5 - 8.1 g/dL   Albumin 4.1 3.5 - 5.0 g/dL   AST 15 15 - 41 U/L   ALT 13 0 - 44 U/L   Alkaline Phosphatase 80 38 - 126 U/L   Total Bilirubin 0.9 0.3 - 1.2 mg/dL   GFR, Estimated >35 >00 mL/min    Comment: (NOTE) Calculated using the CKD-EPI Creatinine Equation (2021)    Anion gap 6 5 - 15    Comment: Performed at [facility], [address] [place], [place] [address]  CBC     Status: None   Collection Time: 12/09/20  6:40 PM  Result Value Ref Range   WBC 7.4 4.0 - 10.5 K/uL   RBC 4.81 3.87 - 5.11 MIL/uL   Hemoglobin 13.5 12.0 - 15.0 g/dL   HCT 29.9 37.1 - 69.6 %   MCV 87.1 80.0 - 100.0 fL   MCH 28.1 26.0 - 34.0 pg   MCHC 32.2 30.0 - 36.0 g/dL   RDW 78.9 38.1 - 01.7 %   Platelets 385 150 -  400 K/uL   nRBC 0.0 0.0 - 0.2 %    Comment: Performed at [facility], [address] [place], [place] [address]   CT Abdomen Pelvis W Contrast  Result Date: 12/08/2020 CLINICAL DATA:  Abdominal pain with nausea and diarrhea EXAM: CT ABDOMEN AND PELVIS WITH CONTRAST TECHNIQUE: Multidetector CT imaging of the abdomen and pelvis was performed using the standard protocol following bolus administration of intravenous contrast. CONTRAST:  OMNIPAQUE IOHEXOL 300 MG/ML  SOLN COMPARISON:  None. FINDINGS: LOWER CHEST: Normal. HEPATOBILIARY: Normal hepatic contours. No intra- or extrahepatic biliary dilatation. The gallbladder is normal. PANCREAS: Normal pancreas. No ductal dilatation or peripancreatic fluid collection. SPLEEN: Normal. ADRENALS/URINARY TRACT: The adrenal glands are normal. No hydronephrosis, nephroureterolithiasis or solid renal mass. The urinary bladder is normal for degree of distention STOMACH/BOWEL: There is no hiatal hernia. Normal duodenal course and caliber. No  small bowel dilatation or inflammation. No focal colonic abnormality. Appendiceal diameter is upper limits of normal. There is minimal adjacent fat stranding (series 2, image 49 and series 5, image 50). VASCULAR/LYMPHATIC: Normal course and caliber of the major abdominal vessels. No abdominal or pelvic lymphadenopathy. REPRODUCTIVE: Normal uterus. No adnexal mass. MUSCULOSKELETAL. No bony spinal canal stenosis or focal osseous abnormality. OTHER: None. IMPRESSION: 1. Upper limits of normal appendix with minimal adjacent fat stranding. This may indicate early acute appendicitis. 2. No other acute abnormality of the abdomen or pelvis. Electronically Signed   By: Deatra Robinson M.D.   On: 12/08/2020 20:09   US Abdomen Limited RUQ (LIVER/GB)  Result Date: 12/08/2020 CLINICAL DATA:  Epigastric pain with nausea for 1 day EXAM: ULTRASOUND ABDOMEN LIMITED RIGHT UPPER QUADRANT COMPARISON:  None. FINDINGS: Gallbladder: No gallstones or wall thickening visualized. No sonographic Murphy sign noted by sonographer. Common bile duct: Diameter: 3 mm Liver: Mildly increased liver echogenicity, with preservation of the portal triads, this appearance is likely artifactual/due to technique. Portal vein is patent on color Doppler imaging with normal direction of blood flow towards the liver. Other: None. IMPRESSION: No evidence of cholecystitis or biliary obstruction. Electronically Signed   By: Caprice Renshaw   On: 12/08/2020 17:28    Review of Systems  Constitutional:  Positive for fatigue.  HENT: Negative.    Eyes: Negative.   Respiratory: Negative.    Cardiovascular: Negative.   Gastrointestinal:  Positive for abdominal pain.  Endocrine: Negative.   Genitourinary: Negative.   Musculoskeletal: Negative.   Allergic/Immunologic: Negative.   Neurological: Negative.   Hematological: Negative.   Psychiatric/Behavioral: Negative.     Blood pressure 120/84, pulse 64, temperature 98.3 F (36.8 C), temperature source Oral,  resp. rate 16, height 5\' 1"  (1.549 m), weight 99.8 kg, last menstrual period 12/01/2020, SpO2 100 %, not currently breastfeeding. Physical Exam Vitals reviewed.  Constitutional:      Appearance: She is well-developed. She is not ill-appearing.  HENT:     Head: Normocephalic and atraumatic.  Abdominal:     General: Abdomen is protuberant. Bowel sounds are normal. There is no distension.     Palpations: Abdomen is soft.     Tenderness: There is abdominal tenderness in the right lower quadrant, periumbilical area and suprapubic area. There is no right CVA tenderness, guarding or rebound. Negative signs include Murphy's sign, Rovsing's sign, McBurney's sign, psoas sign and obturator sign.     Hernia: No hernia is present.     Comments: Patient has none specific discomfort to deep palpation in the periumbilical, suprapubic, and right lower quadrant region, though not  quite at McBurney's point.  No rigidity is noted.  It seems to come and go.  Neurological:     Mental Status: She is alert.  CT scan images from yesterday reviewed  Assessment/Plan Impression: Abdominal pain of unknown etiology.  It is surprising that her white count has normalized over the past 24 hours despite not taking any of the prescribed antibiotic.  Her story is atypical for appendicitis.  She agrees that she would like to avoid surgery unless absolutely necessary.  I told her that we will bring her into the hospital and start her on IV antibiotics.  I will reexamine her abdomen in the morning and further management is pending those results.  Repeating the CT scan would not be helpful at this point.  May consider ultrasound of the pelvis should she continue to have suprapubic pain.  Urinalysis was negative for nitrites.  Franky Macho, MD 12/09/2020, 9:08 PM

## 2020-12-09 NOTE — Progress Notes (Signed)
IS given to patient and pt had excellent effort 2500 cc x 10

## 2020-12-10 DIAGNOSIS — R1031 Right lower quadrant pain: Secondary | ICD-10-CM | POA: Diagnosis not present

## 2020-12-10 LAB — CBC
HCT: 36.4 % (ref 36.0–46.0)
Hemoglobin: 12.1 g/dL (ref 12.0–15.0)
MCH: 29.1 pg (ref 26.0–34.0)
MCHC: 33.2 g/dL (ref 30.0–36.0)
MCV: 87.5 fL (ref 80.0–100.0)
Platelets: 339 10*3/uL (ref 150–400)
RBC: 4.16 MIL/uL (ref 3.87–5.11)
RDW: 13 % (ref 11.5–15.5)
WBC: 7.8 10*3/uL (ref 4.0–10.5)
nRBC: 0 % (ref 0.0–0.2)

## 2020-12-10 LAB — HIV ANTIBODY (ROUTINE TESTING W REFLEX): HIV Screen 4th Generation wRfx: NONREACTIVE

## 2020-12-10 NOTE — ED Notes (Signed)
Pt removed her own IV and left the ED without alerting staff. Pt does not have a final set of vitals and did not sign for discharge.

## 2020-12-10 NOTE — Discharge Summary (Addendum)
Physician Discharge Summary  Patient ID: Julie Atkins MRN: [id number] DOB/AGE: 1995/09/13 25 y.o.  Admit date: 12/09/2020 Discharge date: 12/10/2020  Admission Diagnoses: Abdominal pain, question appendicitis  Discharge Diagnoses:  Active Problems:   Abdominal pain, RLQ (right lower quadrant)   Discharged Condition: good  Hospital Course: Patient is a 25 year old white female who had presented to the emergency room on 12/08/2020 with nonspecific upper abdominal pain.  A CT scan of the abdomen at that time revealed a possible early appendicitis.  She had an elevated white blood cell count at that time.  The patient was discharged home after receiving 1 dose of IV antibiotics and was told to return to the emergency room should her abdominal pain worsen.  She returned to the emergency room yesterday evening with lower abdominal pain.  Her white count is normalized despite not taking her prescribed antibiotics.  Her exam was still equivocal for appendicitis.  She was brought into the hospital for observation.  She was started on Zosyn.  Overnight, she states her abdominal pain in the lower part of her abdomen has resolved.  She does have some mild periumbilical and epigastric discomfort.  She states she feels a lot better.  She is hungry.  She denies any nausea or vomiting.  Repeat white blood cell count is normal.  Suspect nonspecific gastroenteritis.  The patient is being discharged home in good and improving condition.  She was instructed to take her antibiotics as prescribed. Incidentally, patient tested positive for Covid.  Will give her instructions about isolation.  Discharge Exam: Blood pressure 121/81, pulse 68, temperature 98.1 F (36.7 C), temperature source Oral, resp. rate 17, height 5\' 1"  (1.549 m), weight 99.8 kg, last menstrual period 12/01/2020, SpO2 100 %, not currently breastfeeding. General appearance: alert, cooperative, and no distress Resp: clear to auscultation  bilaterally Cardio: regular rate and rhythm, S1, S2 normal, no murmur, click, rub or gallop GI: Soft, nontender, nondistended.  No rigidity is noted.  No McBurney's point tenderness elicited.  Disposition: Discharge disposition: 01-Home or Self Care       Discharge Instructions     Diet - low sodium heart healthy   Complete by: As directed    Increase activity slowly   Complete by: As directed       Allergies as of 12/10/2020   No Known Allergies      Medication List     TAKE these medications    amoxicillin-clavulanate 875-125 MG tablet Commonly known as: AUGMENTIN Take 1 tablet by mouth every 12 (twelve) hours.   dicyclomine 20 MG tablet Commonly known as: BENTYL Take 1 tablet (20 mg total) by mouth 2 (two) times daily.   famotidine 20 MG tablet Commonly known as: PEPCID Take 1 tablet (20 mg total) by mouth at bedtime.   ondansetron 4 MG disintegrating tablet Commonly known as: Zofran ODT Take 1 tablet (4 mg total) by mouth every 8 (eight) hours as needed for nausea or vomiting.        Follow-up Information     02/10/2021, MD Follow up.   Specialty: General Surgery Why: As needed Contact information: [address] [place] [phone]                 Signed: [phone] 12/10/2020, 7:14 AM

## 2020-12-11 ENCOUNTER — Telehealth: Payer: Self-pay

## 2020-12-11 NOTE — Telephone Encounter (Signed)
Transition Care Management Unsuccessful Follow-up Telephone Call  Date of discharge and from where:  12/10/2020- [facility]  Attempts:  1st Attempt  Reason for unsuccessful TCM follow-up call:  Unable to reach patient

## 2020-12-14 ENCOUNTER — Telehealth: Payer: Self-pay

## 2020-12-14 NOTE — Telephone Encounter (Signed)
Transition Care Management Follow-up Telephone Call Date of discharge and from where: 12/10/2020- [facility] How have you been since you were released from the hospital? Doing better Any questions or concerns? No  Items Reviewed: Did the pt receive and understand the discharge instructions provided? Yes  Medications obtained and verified? Yes  Other? No  Any new allergies since your discharge? No  Dietary orders reviewed? N/A Do you have support at home? Yes   Home Care and Equipment/Supplies: Were home health services ordered? not applicable If so, what is the name of the agency? N/A  Has the agency set up a time to come to the patient's home? not applicable Were any new equipment or medical supplies ordered?  No What is the name of the medical supply agency? N/A Were you able to get the supplies/equipment? N/A Do you have any questions related to the use of the equipment or supplies? No  Functional Questionnaire: (I = Independent and D = Dependent) ADLs: I  Bathing/Dressing- I  Meal Prep- I  Eating- I  Maintaining continence- I  Transferring/Ambulation- I  Managing Meds- I  Follow up appointments reviewed:  PCP Hospital f/u appt confirmed? No  Patient states she will find her own PCP Specialist Hospital f/u appt confirmed? No   Are transportation arrangements needed? No  If their condition worsens, is the pt aware to call PCP or go to the Emergency Dept.? Yes Was the patient provided with contact information for the PCP's office or ED? Yes Was to pt encouraged to call back with questions or concerns? Yes

## 2021-03-02 ENCOUNTER — Other Ambulatory Visit: Payer: Self-pay | Admitting: Obstetrics & Gynecology

## 2021-03-02 DIAGNOSIS — Z3682 Encounter for antenatal screening for nuchal translucency: Secondary | ICD-10-CM

## 2021-03-03 ENCOUNTER — Other Ambulatory Visit: Payer: Medicaid Other

## 2021-03-03 ENCOUNTER — Ambulatory Visit (INDEPENDENT_AMBULATORY_CARE_PROVIDER_SITE_OTHER): Payer: Medicaid Other

## 2021-03-03 ENCOUNTER — Other Ambulatory Visit: Payer: Self-pay

## 2021-03-03 DIAGNOSIS — Z3A13 13 weeks gestation of pregnancy: Secondary | ICD-10-CM

## 2021-03-03 DIAGNOSIS — Z3682 Encounter for antenatal screening for nuchal translucency: Secondary | ICD-10-CM

## 2021-03-03 DIAGNOSIS — Z3481 Encounter for supervision of other normal pregnancy, first trimester: Secondary | ICD-10-CM

## 2021-03-03 NOTE — Progress Notes (Signed)
[place] 13+1 wks,single IUP,FHR 157 bpm,normal ovaries,NB present,NT 1.4 mm,CRL 60.36 mm

## 2021-03-04 ENCOUNTER — Other Ambulatory Visit: Payer: Medicaid Other

## 2021-03-05 LAB — INTEGRATED 1
Crown Rump Length: 60.4 mm
Gest. Age on Collection Date: 12.3 weeks
Maternal Age at EDD: 24 yr
Nuchal Translucency (NT): 1.4 mm
Number of Fetuses: 1
PAPP-A Value: 719.9 ng/mL
Weight: 215 [lb_av]

## 2021-03-05 LAB — CBC/D/PLT+RPR+RH+ABO+RUBIGG...
Antibody Screen: NEGATIVE
Basophils Absolute: 0.1 10*3/uL (ref 0.0–0.2)
Basos: 1 %
EOS (ABSOLUTE): 0.1 10*3/uL (ref 0.0–0.4)
Eos: 1 %
HCV Ab: <0.1 s/co ratio (ref 0.0–0.9)
HIV Screen 4th Generation wRfx: NONREACTIVE
Hematocrit: 39.3 % (ref 34.0–46.6)
Hemoglobin: 12.9 g/dL (ref 11.1–15.9)
Hepatitis B Surface Ag: NEGATIVE
Immature Grans (Abs): 0 10*3/uL (ref 0.0–0.1)
Immature Granulocytes: 0 %
Lymphocytes Absolute: 2.6 10*3/uL (ref 0.7–3.1)
Lymphs: 29 %
MCH: 27.9 pg (ref 26.6–33.0)
MCHC: 32.8 g/dL (ref 31.5–35.7)
MCV: 85 fL (ref 79–97)
Monocytes Absolute: 0.7 10*3/uL (ref 0.1–0.9)
Monocytes: 7 %
Neutrophils Absolute: 5.5 10*3/uL (ref 1.4–7.0)
Neutrophils: 62 %
Platelets: 360 10*3/uL (ref 150–450)
RBC: 4.62 x10E6/uL (ref 3.77–5.28)
RDW: 13.4 % (ref 11.7–15.4)
RPR Ser Ql: NONREACTIVE
Rh Factor: POSITIVE
Rubella Antibodies, IGG: 1.11 index (ref >0.99)
WBC: 8.8 10*3/uL (ref 3.4–10.8)

## 2021-03-05 LAB — HCV INTERPRETATION

## 2021-03-22 DIAGNOSIS — Z3490 Encounter for supervision of normal pregnancy, unspecified, unspecified trimester: Secondary | ICD-10-CM | POA: Insufficient documentation

## 2021-03-24 ENCOUNTER — Ambulatory Visit (INDEPENDENT_AMBULATORY_CARE_PROVIDER_SITE_OTHER): Payer: Medicaid Other | Admitting: Advanced Practice Midwife

## 2021-03-24 ENCOUNTER — Other Ambulatory Visit: Payer: Self-pay

## 2021-03-24 ENCOUNTER — Encounter: Payer: Self-pay | Admitting: Advanced Practice Midwife

## 2021-03-24 ENCOUNTER — Ambulatory Visit: Payer: Medicaid Other | Admitting: *Deleted

## 2021-03-24 VITALS — BP 127/83 | HR 98 | Wt 213.0 lb

## 2021-03-24 DIAGNOSIS — Z23 Encounter for immunization: Secondary | ICD-10-CM

## 2021-03-24 DIAGNOSIS — Z8759 Personal history of other complications of pregnancy, childbirth and the puerperium: Secondary | ICD-10-CM | POA: Diagnosis not present

## 2021-03-24 DIAGNOSIS — Z3A16 16 weeks gestation of pregnancy: Secondary | ICD-10-CM | POA: Diagnosis not present

## 2021-03-24 DIAGNOSIS — Z3480 Encounter for supervision of other normal pregnancy, unspecified trimester: Secondary | ICD-10-CM

## 2021-03-24 DIAGNOSIS — Z31430 Encounter of female for testing for genetic disease carrier status for procreative management: Secondary | ICD-10-CM | POA: Diagnosis not present

## 2021-03-24 DIAGNOSIS — Z363 Encounter for antenatal screening for malformations: Secondary | ICD-10-CM

## 2021-03-24 LAB — POCT URINALYSIS DIPSTICK OB
Blood, UA: NEGATIVE
Glucose, UA: NEGATIVE
Ketones, UA: NEGATIVE
Leukocytes, UA: NEGATIVE
Nitrite, UA: NEGATIVE
POC,PROTEIN,UA: NEGATIVE

## 2021-03-24 NOTE — Progress Notes (Signed)
INITIAL OBSTETRICAL VISIT Patient name: Julie Atkins MRN [id number]  Date of birth: [birth year] Chief Complaint:   Initial Prenatal Visit (Still having nausea)  History of Present Illness:   Julie Atkins is a 25 y.o. G35P2002 Caucasian female at [redacted]w[redacted]d by LMP c/w u/s at 13.1 weeks with an Estimated Date of Delivery: 09/07/21 being seen today for her initial obstetrical visit.   Patient's last menstrual period was 12/01/2020. Her obstetrical history is significant for  term SVB x 2 (G1 at [facility]- weighed 9+1, no SD, PPH treated w meds and no blood tx; G2 at [facility]- no difficulties) .   Today she reports no complaints.  Last pap May 2020. Results were: NILM w/ HRHPV not done  Depression screen [facility] 2/9 03/24/2021 09/19/2018 06/29/2018  Decreased Interest 0 0 0  Down, Depressed, Hopeless 0 0 0  PHQ - 2 Score 0 0 0  Altered sleeping 0 0 -  Tired, decreased energy 1 0 -  Change in appetite 1 0 -  Feeling bad or failure about yourself  0 0 -  Trouble concentrating 0 0 -  Moving slowly or fidgety/restless 0 0 -  Suicidal thoughts 0 0 -  PHQ-9 Score 2 0 -     GAD 7 : Generalized Anxiety Score 03/24/2021  Nervous, Anxious, on Edge 0  Control/stop worrying 0  Worry too much - different things 0  Trouble relaxing 0  Restless 0  Easily annoyed or irritable 0  Afraid - awful might happen 0  Total GAD 7 Score 0     Review of Systems:   Pertinent items are noted in HPI Denies cramping/contractions, leakage of fluid, vaginal bleeding, abnormal vaginal discharge w/ itching/odor/irritation, headaches, visual changes, shortness of breath, chest pain, abdominal pain, severe nausea/vomiting, or problems with urination or bowel movements unless otherwise stated above.  Pertinent History Reviewed:  Reviewed past medical,surgical, social, obstetrical and family history.  Reviewed problem list, medications and allergies. OB History  Gravida Para Term Preterm AB Living  3 2 2     2   SAB IAB  Ectopic Multiple Live Births        0 2    # Outcome Date GA Lbr Len/2nd Weight Sex Delivery Anes PTL Lv  3 Current           2 Term 01/18/19 [redacted]w[redacted]d 11:34 / 00:13 8 lb 0.6 oz (3.646 kg) M Vag-Spont EPI  LIV  1 Term 01/31/18 [redacted]w[redacted]d  9 lb 1 oz (4.111 kg) M Vag-Spont EPI N LIV   Physical Assessment:   Vitals:   03/24/21 1503  BP: 127/83  Pulse: 98  Weight: 213 lb (96.6 kg)  Body mass index is 40.25 kg/m.       Physical Examination:  General appearance - well appearing, and in no distress  Mental status - alert, oriented to person, place, and time  Psych:  She has a normal mood and affect  Skin - warm and dry, normal color, no suspicious lesions noted  Chest - effort normal, all lung fields clear to auscultation bilaterally  Heart - normal rate and regular rhythm  Abdomen - soft, nontender; gravid, FHTs 150bpm  Extremities:  No swelling or varicosities noted  Pelvic - not indicated  Thin prep pap is not done  NT from 03/03/21: 03/05/21 13+1 wks,single IUP,FHR 157 bpm,normal ovaries,NB present,NT 1.4 mm,CRL 60.36 mm  Results for orders placed or performed in visit on 03/24/21 (from the past 24 hour(s))  POC Urinalysis Dipstick OB  Collection Time: 03/24/21  3:37 PM  Result Value Ref Range   Color, UA     Clarity, UA     Glucose, UA Negative Negative   Bilirubin, UA     Ketones, UA neg    Spec Grav, UA     Blood, UA neg    pH, UA     POC,PROTEIN,UA Negative Negative, Trace, Small (1+), Moderate (2+), Large (3+), 4+   Urobilinogen, UA     Nitrite, UA neg    Leukocytes, UA Negative Negative   Appearance     Odor      Assessment & Plan:  1) Low-Risk Pregnancy G3P2002 at [redacted]w[redacted]d with an Estimated Date of Delivery: 09/07/21   2) Initial OB visit  3) Hx PPH with G1, tx with rectal Phenergan, no blood tx  4) Hx LGA with G1 (9+1), no SD  Meds: No orders of the defined types were placed in this encounter.   Initial labs obtained on 03/03/21; 2nd IT today; flu inj given today   Continue prenatal vitamins Reviewed n/v relief measures and warning s/s to report Reviewed recommended weight gain based on pre-gravid BMI Encouraged well-balanced diet Genetic & carrier screening discussed: requests Panorama and NT/IT, requests Horizon  Ultrasound discussed; fetal survey: requested CCNC completed> form faxed if has or is planning to apply for medicaid The nature of [facility] [employer] with multiple MDs and other Advanced Practice Providers was explained to patient; also emphasized that fellows, residents, and students are part of our team. Does have home bp cuff. Office bp cuff given: no. Rx sent: n/a. Check bp weekly, let us know if consistently >140/90.   No indications for ASA therapy or HgbA1c (per uptodate)   Follow-up: Return in about 4 weeks (around 04/21/2021) for LROB, [place]: Anatomy, in person.   Orders Placed This Encounter  Procedures   GC/Chlamydia Probe Amp   Urine Culture   US OB Comp + 14 Wk   Pain Management Screening Profile (10S)   Genetic Screening   POC Urinalysis Dipstick OB    Arabella Merles [facility] 03/24/2021 3:42 PM

## 2021-03-24 NOTE — Patient Instructions (Signed)
Elease, thank you for choosing our office today! We appreciate the opportunity to meet your healthcare needs. You may receive a short survey by mail, e-mail, or through MyChart. If you are happy with your care we would appreciate if you could take just a few minutes to complete the survey questions. We read all of your comments and take your feedback very seriously. Thank you again for choosing our office.  [facility] ([address] [place], [place] [address]) Entrance C, located off of [address] Free 24/7 valet parking   Nausea & Vomiting Have saltine crackers or pretzels by your bed and eat a few bites before you raise your head out of bed in the morning Eat small frequent meals throughout the day instead of large meals Drink plenty of fluids throughout the day to stay hydrated, just don't drink a lot of fluids with your meals.  This can make your stomach fill up faster making you feel sick Do not brush your teeth right after you eat Products with real ginger are good for nausea, like ginger ale and ginger hard candy Make sure it says made with real ginger! Sucking on sour candy like lemon heads is also good for nausea If your prenatal vitamins make you nauseated, take them at night so you will sleep through the nausea Sea Bands If you feel like you need medicine for the nausea & vomiting please let us know If you are unable to keep any fluids or food down please let us know   Constipation Drink plenty of fluid, preferably water, throughout the day Eat foods high in fiber such as fruits, vegetables, and grains Exercise, such as walking, is a good way to keep your bowels regular Drink warm fluids, especially warm prune juice, or decaf coffee Eat a 1/2 cup of real oatmeal (not instant), 1/2 cup applesauce, and 1/2-1 cup warm prune juice every day If needed, you may take Colace (docusate sodium) stool  softener once or twice a day to help keep the stool soft.  If you still are having problems with constipation, you may take Miralax once daily as needed to help keep your bowels regular.   Home Blood Pressure Monitoring for Patients   Your provider has recommended that you check your blood pressure (BP) at least once a week at home. If you do not have a blood pressure cuff at home, one will be provided for you. Contact your provider if you have not received your monitor within 1 week.   Helpful Tips for Accurate Home Blood Pressure Checks  Don't smoke, exercise, or drink caffeine 30 minutes before checking your BP Use the restroom before checking your BP (a full bladder can raise your pressure) Relax in a comfortable upright chair Feet on the ground Left arm resting comfortably on a flat surface at the level of your heart Legs uncrossed Back supported Sit quietly and don't talk Place the cuff on your bare arm Adjust snuggly, so that only two fingertips can fit between your skin and the top of the cuff Check 2 readings separated by at least one minute Keep a log of your BP readings For a visual, please reference this diagram: http://ccnc.care/bpdiagram  Provider Name: [facility]     Phone: [phone]  Zone 1: ALL CLEAR  Continue to monitor your symptoms:  BP reading is less than 140 (top number) or less than 90 (bottom   number)  No right upper stomach pain No headaches or seeing spots No feeling nauseated or throwing up No swelling in face and hands  Zone 2: CAUTION Call your doctor's office for any of the following:  BP reading is greater than 140 (top number) or greater than 90 (bottom number)  Stomach pain under your ribs in the middle or right side Headaches or seeing spots Feeling nauseated or throwing up Swelling in face and hands  Zone 3: EMERGENCY  Seek immediate medical care if you have any of the following:  BP reading is greater than160 (top number) or  greater than 110 (bottom number) Severe headaches not improving with Tylenol Serious difficulty catching your breath Any worsening symptoms from Zone 2    First Trimester of Pregnancy The first trimester of pregnancy is from week 1 until the end of week 12 (months 1 through 3). A week after a sperm fertilizes an egg, the egg will implant on the wall of the uterus. This embryo will begin to develop into a baby. Genes from you and your partner are forming the baby. The female genes determine whether the baby is a boy or a girl. At 6-8 weeks, the eyes and face are formed, and the heartbeat can be seen on ultrasound. At the end of 12 weeks, all the baby's organs are formed.  Now that you are pregnant, you will want to do everything you can to have a healthy baby. Two of the most important things are to get good prenatal care and to follow your health care provider's instructions. Prenatal care is all the medical care you receive before the baby's birth. This care will help prevent, find, and treat any problems during the pregnancy and childbirth. BODY CHANGES Your body goes through many changes during pregnancy. The changes vary from woman to woman.  You may gain or lose a couple of pounds at first. You may feel sick to your stomach (nauseous) and throw up (vomit). If the vomiting is uncontrollable, call your health care provider. You may tire easily. You may develop headaches that can be relieved by medicines approved by your health care provider. You may urinate more often. Painful urination may mean you have a bladder infection. You may develop heartburn as a result of your pregnancy. You may develop constipation because certain hormones are causing the muscles that push waste through your intestines to slow down. You may develop hemorrhoids or swollen, bulging veins (varicose veins). Your breasts may begin to grow larger and become tender. Your nipples may stick out more, and the tissue that  surrounds them (areola) may become darker. Your gums may bleed and may be sensitive to brushing and flossing. Dark spots or blotches (chloasma, mask of pregnancy) may develop on your face. This will likely fade after the baby is born. Your menstrual periods will stop. You may have a loss of appetite. You may develop cravings for certain kinds of food. You may have changes in your emotions from day to day, such as being excited to be pregnant or being concerned that something may go wrong with the pregnancy and baby. You may have more vivid and strange dreams. You may have changes in your hair. These can include thickening of your hair, rapid growth, and changes in texture. Some women also have hair loss during or after pregnancy, or hair that feels dry or thin. Your hair will most likely return to normal after your baby is born. WHAT TO EXPECT AT YOUR PRENATAL  VISITS During a routine prenatal visit: You will be weighed to make sure you and the baby are growing normally. Your blood pressure will be taken. Your abdomen will be measured to track your baby's growth. The fetal heartbeat will be listened to starting around week 10 or 12 of your pregnancy. Test results from any previous visits will be discussed. Your health care provider may ask you: How you are feeling. If you are feeling the baby move. If you have had any abnormal symptoms, such as leaking fluid, bleeding, severe headaches, or abdominal cramping. If you have any questions. Other tests that may be performed during your first trimester include: Blood tests to find your blood type and to check for the presence of any previous infections. They will also be used to check for low iron levels (anemia) and Rh antibodies. Later in the pregnancy, blood tests for diabetes will be done along with other tests if problems develop. Urine tests to check for infections, diabetes, or protein in the urine. An ultrasound to confirm the proper growth  and development of the baby. An amniocentesis to check for possible genetic problems. Fetal screens for spina bifida and Down syndrome. You may need other tests to make sure you and the baby are doing well. HOME CARE INSTRUCTIONS  Medicines Follow your health care provider's instructions regarding medicine use. Specific medicines may be either safe or unsafe to take during pregnancy. Take your prenatal vitamins as directed. If you develop constipation, try taking a stool softener if your health care provider approves. Diet Eat regular, well-balanced meals. Choose a variety of foods, such as meat or vegetable-based protein, fish, milk and low-fat dairy products, vegetables, fruits, and whole grain breads and cereals. Your health care provider will help you determine the amount of weight gain that is right for you. Avoid raw meat and uncooked cheese. These carry germs that can cause birth defects in the baby. Eating four or five small meals rather than three large meals a day may help relieve nausea and vomiting. If you start to feel nauseous, eating a few soda crackers can be helpful. Drinking liquids between meals instead of during meals also seems to help nausea and vomiting. If you develop constipation, eat more high-fiber foods, such as fresh vegetables or fruit and whole grains. Drink enough fluids to keep your urine clear or pale yellow. Activity and Exercise Exercise only as directed by your health care provider. Exercising will help you: Control your weight. Stay in shape. Be prepared for labor and delivery. Experiencing pain or cramping in the lower abdomen or low back is a good sign that you should stop exercising. Check with your health care provider before continuing normal exercises. Try to avoid standing for long periods of time. Move your legs often if you must stand in one place for a long time. Avoid heavy lifting. Wear low-heeled shoes, and practice good posture. You may  continue to have sex unless your health care provider directs you otherwise. Relief of Pain or Discomfort Wear a good support bra for breast tenderness.   Take warm sitz baths to soothe any pain or discomfort caused by hemorrhoids. Use hemorrhoid cream if your health care provider approves.   Rest with your legs elevated if you have leg cramps or low back pain. If you develop varicose veins in your legs, wear support hose. Elevate your feet for 15 minutes, 3-4 times a day. Limit salt in your diet. Prenatal Care Schedule your prenatal visits by the  twelfth week of pregnancy. They are usually scheduled monthly at first, then more often in the last 2 months before delivery. Write down your questions. Take them to your prenatal visits. Keep all your prenatal visits as directed by your health care provider. Safety Wear your seat belt at all times when driving. Make a list of emergency phone numbers, including numbers for family, friends, the hospital, and police and fire departments. General Tips Ask your health care provider for a referral to a local prenatal education class. Begin classes no later than at the beginning of month 6 of your pregnancy. Ask for help if you have counseling or nutritional needs during pregnancy. Your health care provider can offer advice or refer you to specialists for help with various needs. Do not use hot tubs, steam rooms, or saunas. Do not douche or use tampons or scented sanitary pads. Do not cross your legs for long periods of time. Avoid cat litter boxes and soil used by cats. These carry germs that can cause birth defects in the baby and possibly loss of the fetus by miscarriage or stillbirth. Avoid all smoking, herbs, alcohol, and medicines not prescribed by your health care provider. Chemicals in these affect the formation and growth of the baby. Schedule a dentist appointment. At home, brush your teeth with a soft toothbrush and be gentle when you floss. SEEK  MEDICAL CARE IF:  You have dizziness. You have mild pelvic cramps, pelvic pressure, or nagging pain in the abdominal area. You have persistent nausea, vomiting, or diarrhea. You have a bad smelling vaginal discharge. You have pain with urination. You notice increased swelling in your face, hands, legs, or ankles. SEEK IMMEDIATE MEDICAL CARE IF:  You have a fever. You are leaking fluid from your vagina. You have spotting or bleeding from your vagina. You have severe abdominal cramping or pain. You have rapid weight gain or loss. You vomit blood or material that looks like coffee grounds. You are exposed to [place] measles and have never had them. You are exposed to fifth disease or chickenpox. You develop a severe headache. You have shortness of breath. You have any kind of trauma, such as from a fall or a car accident. Document Released: 05/17/2001 Document Revised: 10/07/2013 Document Reviewed: 04/02/2013 [facility] Patient Information 2015 [place], [place]. This information is not intended to replace advice given to you by your health care provider. Make sure you discuss any questions you have with your health care provider.

## 2021-03-25 LAB — PMP SCREEN PROFILE (10S), URINE
Amphetamine Scrn, Ur: NEGATIVE ng/mL
BARBITURATE SCREEN URINE: NEGATIVE ng/mL
BENZODIAZEPINE SCREEN, URINE: NEGATIVE ng/mL
CANNABINOIDS UR QL SCN: NEGATIVE ng/mL
Cocaine (Metab) Scrn, Ur: NEGATIVE ng/mL
Creatinine(Crt), U: 308.3 mg/dL — ABNORMAL HIGH (ref 20.0–300.0)
Methadone Screen, Urine: NEGATIVE ng/mL
OXYCODONE+OXYMORPHONE UR QL SCN: NEGATIVE ng/mL
Opiate Scrn, Ur: NEGATIVE ng/mL
Ph of Urine: 5.9 (ref 4.5–8.9)
Phencyclidine Qn, Ur: NEGATIVE ng/mL
Propoxyphene Scrn, Ur: NEGATIVE ng/mL

## 2021-03-26 LAB — INTEGRATED 2
AFP MoM: 1.08
Alpha-Fetoprotein: 24.5 ng/mL
Crown Rump Length: 60.4 mm
DIA MoM: 1.75
DIA Value: 236.4 pg/mL
Estriol, Unconjugated: 0.73 ng/mL
Gest. Age on Collection Date: 12.3 weeks
Gestational Age: 15.3 weeks
Maternal Age at EDD: 24 yr
Nuchal Translucency (NT): 1.4 mm
Nuchal Translucency MoM: 1.04
Number of Fetuses: 1
PAPP-A MoM: 1.26
PAPP-A Value: 719.9 ng/mL
Test Results:: NEGATIVE
Weight: 215 [lb_av]
Weight: 215 [lb_av]
hCG MoM: 1.42
hCG Value: 52.4 IU/mL
uE3 MoM: 1

## 2021-03-26 LAB — GC/CHLAMYDIA PROBE AMP
Chlamydia trachomatis, NAA: NEGATIVE
Neisseria Gonorrhoeae by PCR: NEGATIVE

## 2021-03-28 LAB — URINE CULTURE

## 2021-04-02 ENCOUNTER — Encounter: Payer: Self-pay | Admitting: Advanced Practice Midwife

## 2021-04-06 ENCOUNTER — Encounter: Payer: Self-pay | Admitting: Women's Health

## 2021-04-06 DIAGNOSIS — Z148 Genetic carrier of other disease: Secondary | ICD-10-CM | POA: Insufficient documentation

## 2021-04-08 ENCOUNTER — Encounter: Payer: Self-pay | Admitting: *Deleted

## 2021-04-28 ENCOUNTER — Ambulatory Visit (INDEPENDENT_AMBULATORY_CARE_PROVIDER_SITE_OTHER): Payer: Medicaid Other

## 2021-04-28 ENCOUNTER — Ambulatory Visit (INDEPENDENT_AMBULATORY_CARE_PROVIDER_SITE_OTHER): Payer: Medicaid Other | Admitting: Advanced Practice Midwife

## 2021-04-28 ENCOUNTER — Encounter: Payer: Self-pay | Admitting: Advanced Practice Midwife

## 2021-04-28 ENCOUNTER — Other Ambulatory Visit: Payer: Self-pay

## 2021-04-28 VITALS — BP 108/68 | HR 84 | Wt 210.5 lb

## 2021-04-28 DIAGNOSIS — Z3480 Encounter for supervision of other normal pregnancy, unspecified trimester: Secondary | ICD-10-CM

## 2021-04-28 DIAGNOSIS — Z363 Encounter for antenatal screening for malformations: Secondary | ICD-10-CM

## 2021-04-28 DIAGNOSIS — Z3A21 21 weeks gestation of pregnancy: Secondary | ICD-10-CM | POA: Diagnosis not present

## 2021-04-28 NOTE — Progress Notes (Signed)
[place] 21+1 wks,cephalic,fundal placenta gr 0,normal ovaries,cx 5.3 cm,svp of fluid 5 cm,FHR 137 bpm,EFW 361 g 18%,anatomy complete,no obvious abnormalities

## 2021-04-28 NOTE — Progress Notes (Signed)
   PRENATAL VISIT NOTE  Subjective:  Julie Atkins is a 25 y.o. G3P2002 at [redacted]w[redacted]d being seen today for ongoing prenatal care.  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  Patient reports no complaints.  Contractions: Not present. Vag. Bleeding: None.  Movement: Present. Denies leaking of fluid.   The following portions of the patient's history were reviewed and updated as appropriate: allergies, current medications, past family history, past medical history, past social history, past surgical history and problem list. Problem list updated.  Objective:   Vitals:   04/28/21 1600  BP: 108/68  Pulse: 84  Weight: 210 lb 8 oz (95.5 kg)    Fetal Status: Fetal Heart Rate (bpm): 145   Movement: Present     General:  Alert, oriented and cooperative. Patient is in no acute distress.  Skin: Skin is warm and dry. No rash noted.   Cardiovascular: Normal heart rate noted  Respiratory: Normal respiratory effort, no problems with respiration noted  Abdomen: Soft, gravid, appropriate for gestational age.  Pain/Pressure: Absent     Pelvic: Cervical exam deferred        Extremities: Normal range of motion.  Edema: None  Mental Status: Normal mood and affect. Normal behavior. Normal judgment and thought content.   Assessment and Plan:  Pregnancy: G3P2002 at [redacted]w[redacted]d  1. Supervision of other normal pregnancy, antepartum - Routine care  2. [redacted] weeks gestation of pregnancy   3. Antenatal screening for malformation using ultrasonics - [place] today, EFW 18%  Preterm labor symptoms and general obstetric precautions including but not limited to vaginal bleeding, contractions, leaking of fluid and fetal movement were reviewed in detail with the patient. Please refer to  After Visit Summary for other counseling recommendations.  Return in about 4 weeks (around 05/26/2021).  Future Appointments  Date Time Provider Department Center  05/26/2021  3:50 PM Arabella Merles, CNM [facility]    [place], [place]

## 2021-05-17 ENCOUNTER — Encounter: Payer: Self-pay | Admitting: Advanced Practice Midwife

## 2021-05-26 ENCOUNTER — Encounter: Payer: Medicaid Other | Admitting: Advanced Practice Midwife

## 2021-05-28 ENCOUNTER — Encounter: Payer: Medicaid Other | Admitting: Obstetrics & Gynecology

## 2021-06-02 ENCOUNTER — Encounter: Payer: Medicaid Other | Admitting: Obstetrics & Gynecology

## 2021-06-02 DIAGNOSIS — R070 Pain in throat: Secondary | ICD-10-CM | POA: Diagnosis not present

## 2021-06-02 DIAGNOSIS — M791 Myalgia, unspecified site: Secondary | ICD-10-CM | POA: Diagnosis not present

## 2021-06-02 DIAGNOSIS — Z20822 Contact with and (suspected) exposure to covid-19: Secondary | ICD-10-CM | POA: Diagnosis not present

## 2021-06-04 ENCOUNTER — Encounter: Payer: Self-pay | Admitting: Advanced Practice Midwife

## 2021-06-06 NOTE — L&D Delivery Note (Signed)
Vaginal Delivery Note ? ?Pre-procedure Diagnosis: SIUP @ [redacted]w[redacted]d ?Indications: 26 y.o. [id number] Estimated Date of Delivery: 09/07/21 here today for Elective at term. Her pregnancy has been complicated by carrier for fragile X chromosome, OB hx PPH . Her labor course was uncomplicated.   ?Post-procedure Diagnosis: SIUP @ [redacted]w[redacted]d ; same ? ?Provider: Greggory Keen, SNM; Derrill Memo CNM ? ?Anesthesia: epidural ? ?Complications: none ? ?Delivery Estimated Blood Loss (EBL): 60  mL ? ?Transfusions: none ? ?Pathology: placenta discarded on l&d routinely ? ?Labor Events: ?Rupture date: 09/02/2021 , at 7:27 AM .  ?Rupture type: Artificial [2]  ?Fluid characteristic: Clear [1]  ?Interval from ROM to Delivery: 0h 63m ?Induction: Misoprostol [4];Pitocin [5];IP Foley [6]  ?Augmentation: Pitocin [6]  ?Sex: F ? ?Delivery Information for baby girl of Julie Atkins ?Time of Birth: 8:22 AM  ?Baby Weight:  pending ? ?APGARS One minute Five minutes ?Totals:      ?Newborn is AGA and being seen by transition team. Patient plans to breastfeed.   ? ?Procedure:  ? The patient was in lithotomy, draped in a routine fashion.   ?SVD of a viable female infant in cephalic presentation delivered spontaneously over an intact supported perineum. No nuchal cord. No dystocia. No meconium present. Nose and mouth suctioned with bulb; cord clamped and cut. Short cord noted. The placenta was then delivered spontaneously and intact via  initial [place] presentation and Damita Dunnings on final expulsion ; 3VC. The uterine fundus was firm after uterine massage and with a 300 ml bolus of 30 units of pitocin. Inspection revealed  no lacerations. No repair required.  Instrument, sponge, and needle counts were correct at the end of the procedure. ? ? ?Disposition: stable to [facility] transfer ? ? ?Electronically Signed By: ?Greggory Keen, SNM ?09/02/21 8:44 AM   ?

## 2021-06-17 ENCOUNTER — Ambulatory Visit (INDEPENDENT_AMBULATORY_CARE_PROVIDER_SITE_OTHER): Payer: Medicaid Other | Admitting: Advanced Practice Midwife

## 2021-06-17 ENCOUNTER — Other Ambulatory Visit: Payer: Self-pay

## 2021-06-17 VITALS — BP 118/69 | HR 93 | Wt 213.0 lb

## 2021-06-17 DIAGNOSIS — Z3480 Encounter for supervision of other normal pregnancy, unspecified trimester: Secondary | ICD-10-CM

## 2021-06-17 DIAGNOSIS — Z3A28 28 weeks gestation of pregnancy: Secondary | ICD-10-CM | POA: Diagnosis not present

## 2021-06-17 DIAGNOSIS — Z23 Encounter for immunization: Secondary | ICD-10-CM | POA: Diagnosis not present

## 2021-06-17 NOTE — Patient Instructions (Signed)

## 2021-06-17 NOTE — Progress Notes (Signed)
° °  LOW-RISK PREGNANCY VISIT Patient name: Julie Atkins MRN [id number]  Date of birth: 29-Dec-1995 Chief Complaint:   Routine Prenatal Visit  History of Present Illness:   Julie Atkins is a 26 y.o. G38P2002 female at [redacted]w[redacted]d with an Estimated Date of Delivery: 09/07/21 being seen today for ongoing management of a low-risk pregnancy.  Today she reports Sym pubis pain  pelvic pressure.  Recommended maternity belt.. Contractions: Not present. Vag. Bleeding: None.  Movement: Present. denies leaking of fluid. Review of Systems:   Pertinent items are noted in HPI Denies abnormal vaginal discharge w/ itching/odor/irritation, headaches, visual changes, shortness of breath, chest pain, abdominal pain, severe nausea/vomiting, or problems with urination or bowel movements unless otherwise stated above. Pertinent History Reviewed:  Reviewed past medical,surgical, social, obstetrical and family history.  Reviewed problem list, medications and allergies. Physical Assessment:   Vitals:   06/17/21 1406  BP: 118/69  Pulse: 93  Weight: 213 lb (96.6 kg)  Body mass index is 40.25 kg/m.        Physical Examination:   General appearance: Well appearing, and in no distress  Mental status: Alert, oriented to person, place, and time  Skin: Warm & dry  Cardiovascular: Normal heart rate noted  Respiratory: Normal respiratory effort, no distress  Abdomen: Soft, gravid, nontender  Pelvic: Cervical exam deferred         Extremities: Edema: None  Fetal Status:     Movement: Present    Chaperone: n/a    No results found for this or any previous visit (from the past 24 hour(s)).  Assessment & Plan:  1) Low-risk pregnancy G3P2002 at [redacted]w[redacted]d with an Estimated Date of Delivery: 09/07/21      Meds: No orders of the defined types were placed in this encounter.  Labs/procedures today: none  Plan:  Continue routine obstetrical care  Next visit: prefers will be in person for PN2     Reviewed: Preterm labor  symptoms and general obstetric precautions including but not limited to vaginal bleeding, contractions, leaking of fluid and fetal movement were reviewed in detail with the patient.  All questions were answered. Has home bp cuff. . Check bp weekly, let us know if >140/90.   Follow-up: Return for asap for PN2 only; 3 weeks for LrOB.  Orders Placed This Encounter  Procedures   Tdap vaccine greater than or equal to 7yo IM   Christin Fudge DNP, CNM 06/17/2021 2:51 PM

## 2021-06-22 ENCOUNTER — Other Ambulatory Visit: Payer: Medicaid Other

## 2021-06-22 DIAGNOSIS — Z3403 Encounter for supervision of normal first pregnancy, third trimester: Secondary | ICD-10-CM

## 2021-06-22 DIAGNOSIS — Z131 Encounter for screening for diabetes mellitus: Secondary | ICD-10-CM

## 2021-06-22 DIAGNOSIS — Z3A29 29 weeks gestation of pregnancy: Secondary | ICD-10-CM

## 2021-06-23 ENCOUNTER — Other Ambulatory Visit: Payer: Self-pay | Admitting: Women's Health

## 2021-06-23 LAB — ANTIBODY SCREEN: Antibody Screen: NEGATIVE

## 2021-06-23 LAB — CBC
Hematocrit: 29.9 % — ABNORMAL LOW (ref 34.0–46.6)
Hemoglobin: 10 g/dL — ABNORMAL LOW (ref 11.1–15.9)
MCH: 27.4 pg (ref 26.6–33.0)
MCHC: 33.4 g/dL (ref 31.5–35.7)
MCV: 82 fL (ref 79–97)
Platelets: 394 10*3/uL (ref 150–450)
RBC: 3.65 x10E6/uL — ABNORMAL LOW (ref 3.77–5.28)
RDW: 11.9 % (ref 11.7–15.4)
WBC: 8.5 10*3/uL (ref 3.4–10.8)

## 2021-06-23 LAB — RPR: RPR Ser Ql: NONREACTIVE

## 2021-06-23 LAB — GLUCOSE TOLERANCE, 2 HOURS W/ 1HR
Glucose, 1 hour: 89 mg/dL (ref 70–179)
Glucose, 2 hour: 114 mg/dL (ref 70–152)
Glucose, Fasting: 91 mg/dL (ref 70–91)

## 2021-06-23 LAB — HIV ANTIBODY (ROUTINE TESTING W REFLEX): HIV Screen 4th Generation wRfx: NONREACTIVE

## 2021-06-23 MED ORDER — FERROUS SULFATE 325 (65 FE) MG PO TABS: 325.0000 mg | ORAL_TABLET | ORAL | 2 refills | Status: AC

## 2021-07-08 ENCOUNTER — Ambulatory Visit (INDEPENDENT_AMBULATORY_CARE_PROVIDER_SITE_OTHER): Payer: Medicaid Other | Admitting: Women's Health

## 2021-07-08 ENCOUNTER — Encounter: Payer: Self-pay | Admitting: Women's Health

## 2021-07-08 ENCOUNTER — Other Ambulatory Visit: Payer: Self-pay

## 2021-07-08 VITALS — BP 123/71 | HR 98 | Wt 212.4 lb

## 2021-07-08 DIAGNOSIS — Z3480 Encounter for supervision of other normal pregnancy, unspecified trimester: Secondary | ICD-10-CM

## 2021-07-08 DIAGNOSIS — Z3A31 31 weeks gestation of pregnancy: Secondary | ICD-10-CM

## 2021-07-08 DIAGNOSIS — Z3483 Encounter for supervision of other normal pregnancy, third trimester: Secondary | ICD-10-CM

## 2021-07-08 NOTE — Progress Notes (Signed)
LOW-RISK PREGNANCY VISIT Patient name: Julie Atkins MRN [id number]  Date of birth: 1996-04-28 Chief Complaint:   Routine Prenatal Visit  History of Present Illness:   Giavonna Pflum is a 26 y.o. G29P2002 female at [redacted]w[redacted]d with an Estimated Date of Delivery: 09/07/21 being seen today for ongoing management of a low-risk pregnancy.   Today she reports diarrhea. Thinks she wants BTL Contractions: Not present. Vag. Bleeding: None.  Movement: Present. denies leaking of fluid.  Depression screen [facility] 2/9 03/24/2021 09/19/2018 06/29/2018  Decreased Interest 0 0 0  Down, Depressed, Hopeless 0 0 0  PHQ - 2 Score 0 0 0  Altered sleeping 0 0 -  Tired, decreased energy 1 0 -  Change in appetite 1 0 -  Feeling bad or failure about yourself  0 0 -  Trouble concentrating 0 0 -  Moving slowly or fidgety/restless 0 0 -  Suicidal thoughts 0 0 -  PHQ-9 Score 2 0 -     GAD 7 : Generalized Anxiety Score 03/24/2021  Nervous, Anxious, on Edge 0  Control/stop worrying 0  Worry too much - different things 0  Trouble relaxing 0  Restless 0  Easily annoyed or irritable 0  Afraid - awful might happen 0  Total GAD 7 Score 0      Review of Systems:   Pertinent items are noted in HPI Denies abnormal vaginal discharge w/ itching/odor/irritation, headaches, visual changes, shortness of breath, chest pain, abdominal pain, severe nausea/vomiting, or problems with urination or bowel movements unless otherwise stated above. Pertinent History Reviewed:  Reviewed past medical,surgical, social, obstetrical and family history.  Reviewed problem list, medications and allergies. Physical Assessment:   Vitals:   07/08/21 1532  BP: 123/71  Pulse: 98  Weight: 212 lb 6.4 oz (96.3 kg)  Body mass index is 40.13 kg/m.        Physical Examination:   General appearance: Well appearing, and in no distress  Mental status: Alert, oriented to person, place, and time  Skin: Warm & dry  Cardiovascular: Normal heart  rate noted  Respiratory: Normal respiratory effort, no distress  Abdomen: Soft, gravid, nontender  Pelvic: Cervical exam deferred         Extremities: Edema: None  Fetal Status: Fetal Heart Rate (bpm): 138 Fundal Height: 32 cm Movement: Present    Chaperone: N/A   No results found for this or any previous visit (from the past 24 hour(s)).  Assessment & Plan:  1) Low-risk pregnancy G3P2002 at [redacted]w[redacted]d with an Estimated Date of Delivery: 09/07/21   2) Diarrhea, can try GI probiotic  3) Interested in BTL>reviewed risks/benefits, discussed high incidence regret <30yo if appropriate, LARCs just as effective, consent signed today    Meds: No orders of the defined types were placed in this encounter.  Labs/procedures today: none  Plan:  Continue routine obstetrical care  Next visit: prefers in person    Reviewed: Preterm labor symptoms and general obstetric precautions including but not limited to vaginal bleeding, contractions, leaking of fluid and fetal movement were reviewed in detail with the patient.  All questions were answered. Does have home bp cuff. Office bp cuff given: not applicable. Check bp weekly, let us know if consistently >140 and/or >90.  Follow-up: Return in about 2 weeks (around 07/22/2021) for LROB, CNM, in person, Sign BTL consent today.  No future appointments.  No orders of the defined types were placed in this encounter.  Cheral Marker CNM, [facility] 07/08/2021 3:56 PM

## 2021-07-08 NOTE — Patient Instructions (Signed)
[place], thank you for choosing our office today! We appreciate the opportunity to meet your healthcare needs. You may receive a short survey by mail, e-mail, or through MyChart. If you are happy with your care we would appreciate if you could take just a few minutes to complete the survey questions. We read all of your comments and take your feedback very seriously. Thank you again for choosing our office.  [facility] [place] ([address] [place], [place] [address]) Entrance C, located off of [address] Free 24/7 valet parking   CLASSES: Go to [email or web address] to register for classes (childbirth, breastfeeding, waterbirth, infant CPR, daddy bootcamp, etc.)  Call the office ([phone]) or go to [facility] if: You begin to have strong, frequent contractions Your water breaks.  Sometimes it is a big gush of fluid, sometimes it is just a trickle that keeps getting your panties wet or running down your legs You have vaginal bleeding.  It is normal to have a small amount of spotting if your cervix was checked.  You don't feel your baby moving like normal.  If you don't, get you something to eat and drink and lay down and focus on feeling your baby move.   If your baby is still not moving like normal, you should call the office or go to [facility].  Call the office ([phone]) or go to [facility] for these signs of pre-eclampsia: Severe headache that does not go away with Tylenol Visual changes- seeing spots, double, blurred vision Pain under your right breast or upper abdomen that does not go away with Tums or heartburn medicine Nausea and/or vomiting Severe swelling in your hands, feet, and face   Tdap Vaccine It is recommended that you get the Tdap vaccine during the third trimester of EACH pregnancy to help protect your baby from getting pertussis (whooping cough) 27-36 weeks is the BEST time to do  this so that you can pass the protection on to your baby. During pregnancy is better than after pregnancy, but if you are unable to get it during pregnancy it will be offered at the hospital.  You can get this vaccine with us, at the health department, your family doctor, or some local pharmacies Everyone who will be around your baby should also be up-to-date on their vaccines before the baby comes. Adults (who are not pregnant) only need 1 dose of Tdap during adulthood.   [place] Pediatricians/Family Doctors [facility] ([facility]): [address], [phone]           [facility]: [address], [phone]                [facility] ([facility]): [address], [phone] (call to ask if accepting patients) [facility]: [address], [place], [phone]    [place] Pediatricians/Family Doctors [facility] ([facility]): [address], [phone] [facility]: [address], [phone] [facility]: [address], [phone]  [place] [facility]  [facility] ([facility]): [phone] [facility]: [address], [phone]   [place] [facility]: [address], [phone]  [place] [facility]  [facility]: [address], [phone]  Home Blood Pressure Monitoring for Patients   Your provider has recommended that you check your   blood pressure (BP) at least once a week at home. If you do not have a blood pressure cuff at home, one will be provided for you. Contact your provider if you have not received your monitor within 1 week.  ° °Helpful Tips for Accurate Home Blood Pressure Checks  °Don't smoke, exercise, or drink caffeine 30 minutes before checking your BP °Use the restroom before checking your BP (a full bladder can raise your  pressure) °Relax in a comfortable upright chair °Feet on the ground °Left arm resting comfortably on a flat surface at the level of your heart °Legs uncrossed °Back supported °Sit quietly and don't talk °Place the cuff on your bare arm °Adjust snuggly, so that only two fingertips can fit between your skin and the top of the cuff °Check 2 readings separated by at least one minute °Keep a log of your BP readings °For a visual, please reference this diagram: http://ccnc.care/bpdiagram ° °Provider Name: [facility]     Phone: [phone] ° °Zone 1: ALL CLEAR  °Continue to monitor your symptoms:  °BP reading is less than 140 (top number) or less than 90 (bottom number)  °No right upper stomach pain °No headaches or seeing spots °No feeling nauseated or throwing up °No swelling in face and hands ° °Zone 2: CAUTION °Call your doctor's office for any of the following:  °BP reading is greater than 140 (top number) or greater than 90 (bottom number)  °Stomach pain under your ribs in the middle or right side °Headaches or seeing spots °Feeling nauseated or throwing up °Swelling in face and hands ° °Zone 3: EMERGENCY  °Seek immediate medical care if you have any of the following:  °BP reading is greater than160 (top number) or greater than 110 (bottom number) °Severe headaches not improving with Tylenol °Serious difficulty catching your breath °Any worsening symptoms from Zone 2  °Preterm Labor and Birth Information ° °The normal length of a pregnancy is 39-41 weeks. Preterm labor is when labor starts before 37 completed weeks of pregnancy. °What are the risk factors for preterm labor? °Preterm labor is more likely to occur in women who: °Have certain infections during pregnancy such as a bladder infection, sexually transmitted infection, or infection inside the uterus (chorioamnionitis). °Have a shorter-than-normal cervix. °Have gone into preterm labor before. °Have had surgery on their cervix. °Are younger than age 17  or older than age 35. °Are African American. °Are pregnant with twins or multiple babies (multiple gestation). °Take street drugs or smoke while pregnant. °Do not gain enough weight while pregnant. °Became pregnant shortly after having been pregnant. °What are the symptoms of preterm labor? °Symptoms of preterm labor include: °Cramps similar to those that can happen during a menstrual period. The cramps may happen with diarrhea. °Pain in the abdomen or lower back. °Regular uterine contractions that may feel like tightening of the abdomen. °A feeling of increased pressure in the pelvis. °Increased watery or bloody mucus discharge from the vagina. °Water breaking (ruptured amniotic sac). °Why is it important to recognize signs of preterm labor? °It is important to recognize signs of preterm labor because babies who are born prematurely may not be fully developed. This can put them at an increased risk for: °Long-term (chronic) heart and lung problems. °Difficulty immediately after birth with regulating body systems, including blood sugar, body temperature, heart rate, and breathing rate. °Bleeding in the brain. °Cerebral palsy. °Learning difficulties. °Death. °These risks are highest for babies who are born before 34 weeks   of pregnancy. How is preterm labor treated? Treatment depends on the length of your pregnancy, your condition, and the health of your baby. It may involve: Having a stitch (suture) placed in your cervix to prevent your cervix from opening too early (cerclage). Taking or being given medicines, such as: Hormone medicines. These may be given early in pregnancy to help support the pregnancy. Medicine to stop contractions. Medicines to help mature the babys lungs. These may be prescribed if the risk of delivery is high. Medicines to prevent your baby from developing cerebral palsy. If the labor happens before 34 weeks of pregnancy, you may need to stay in the hospital. What should I do if I  think I am in preterm labor? If you think that you are going into preterm labor, call your health care provider right away. How can I prevent preterm labor in future pregnancies? To increase your chance of having a full-term pregnancy: Do not use any tobacco products, such as cigarettes, chewing tobacco, and e-cigarettes. If you need help quitting, ask your health care provider. Do not use street drugs or medicines that have not been prescribed to you during your pregnancy. Talk with your health care provider before taking any herbal supplements, even if you have been taking them regularly. Make sure you gain a healthy amount of weight during your pregnancy. Watch for infection. If you think that you might have an infection, get it checked right away. Make sure to tell your health care provider if you have gone into preterm labor before. This information is not intended to replace advice given to you by your health care provider. Make sure you discuss any questions you have with your health care provider. Document Revised: 09/14/2018 Document Reviewed: 10/14/2015 Elsevier Patient Education  [place].

## 2021-07-12 ENCOUNTER — Other Ambulatory Visit: Payer: Self-pay

## 2021-07-12 ENCOUNTER — Ambulatory Visit (INDEPENDENT_AMBULATORY_CARE_PROVIDER_SITE_OTHER): Payer: Medicaid Other | Admitting: *Deleted

## 2021-07-12 ENCOUNTER — Encounter: Payer: Self-pay | Admitting: Women's Health

## 2021-07-12 VITALS — BP 121/69 | HR 103

## 2021-07-12 DIAGNOSIS — Z3A31 31 weeks gestation of pregnancy: Secondary | ICD-10-CM

## 2021-07-12 DIAGNOSIS — Z3480 Encounter for supervision of other normal pregnancy, unspecified trimester: Secondary | ICD-10-CM

## 2021-07-12 NOTE — Progress Notes (Signed)
° °  NURSE VISIT- NST  SUBJECTIVE:  Julie Atkins is a 26 y.o. G72P2002 female at [redacted]w[redacted]d, here for a NST for decreased fetal movement, contractions: none, vaginal bleeding: none, membranes: intact.   OBJECTIVE:  LMP 12/01/2020   Appears well, no apparent distress  No results found for this or any previous visit (from the past 24 hour(s)).  NST: FHR baseline 135 bpm, Variability: moderate, Accelerations:present, Decelerations:  Absent= Cat 1/reactive Toco: none   ASSESSMENT: G3P2002 at [redacted]w[redacted]d with decreased fetal movement NST reactive  PLAN: EFM strip reviewed by Dr. Charlotta Newton   Recommendations: keep next appointment as scheduled    Jobe Marker  07/12/2021 3:37 PM

## 2021-07-21 ENCOUNTER — Ambulatory Visit (INDEPENDENT_AMBULATORY_CARE_PROVIDER_SITE_OTHER): Payer: Medicaid Other | Admitting: Advanced Practice Midwife

## 2021-07-21 ENCOUNTER — Other Ambulatory Visit: Payer: Self-pay

## 2021-07-21 VITALS — BP 112/71 | HR 93 | Wt 212.5 lb

## 2021-07-21 DIAGNOSIS — Z3480 Encounter for supervision of other normal pregnancy, unspecified trimester: Secondary | ICD-10-CM | POA: Diagnosis not present

## 2021-07-21 DIAGNOSIS — Z3A33 33 weeks gestation of pregnancy: Secondary | ICD-10-CM | POA: Diagnosis not present

## 2021-07-21 DIAGNOSIS — O26843 Uterine size-date discrepancy, third trimester: Secondary | ICD-10-CM

## 2021-07-21 DIAGNOSIS — Z302 Encounter for sterilization: Secondary | ICD-10-CM | POA: Insufficient documentation

## 2021-07-21 NOTE — Progress Notes (Signed)
° °  LOW-RISK PREGNANCY VISIT Patient name: Julie Atkins MRN [id number]  Date of birth: 1995/07/24 Chief Complaint:   Routine Prenatal Visit  History of Present Illness:   Julie Atkins is a 26 y.o. G55P2002 female at [redacted]w[redacted]d with an Estimated Date of Delivery: 09/07/21 being seen today for ongoing management of a low-risk pregnancy.  Today she reports  feeling decreased FM over the past week- reports feeling 5-10 FM during an evening hour but minimal FM during the day . Contractions: Not present. Vag. Bleeding: None.  Movement: (!) Decreased. denies leaking of fluid. Review of Systems:   Pertinent items are noted in HPI Denies abnormal vaginal discharge w/ itching/odor/irritation, headaches, visual changes, shortness of breath, chest pain, abdominal pain, severe nausea/vomiting, or problems with urination or bowel movements unless otherwise stated above. Pertinent History Reviewed:  Reviewed past medical,surgical, social, obstetrical and family history.  Reviewed problem list, medications and allergies. Physical Assessment:   Vitals:   07/21/21 1545  BP: 112/71  Pulse: 93  Weight: 212 lb 8 oz (96.4 kg)  Body mass index is 40.15 kg/m.        Physical Examination:   General appearance: Well appearing, and in no distress  Mental status: Alert, oriented to person, place, and time  Skin: Warm & dry  Cardiovascular: Normal heart rate noted  Respiratory: Normal respiratory effort, no distress  Abdomen: Soft, gravid, nontender  Pelvic: Cervical exam deferred         Extremities: Edema: None  Fetal Status: Fetal Heart Rate (bpm): 150s NST Fundal Height: 31 cm Movement: (!) Decreased    No results found for this or any previous visit (from the past 24 hour(s)).  Assessment & Plan:  1) Low-risk pregnancy G3P2002 at [redacted]w[redacted]d with an Estimated Date of Delivery: 09/07/21   2) Decreased FM, reviewed [facility]- baby mostly moves at night and not as much during the day; reactive NST today  3) Uterine  S<D, will get EFW with next visit (was 18th% at anatomy scan)   Meds: No orders of the defined types were placed in this encounter.  Labs/procedures today: NST: baseline FHR 145-150, +accels, no decels, no ctx    Plan:  Continue routine obstetrical care   Reviewed: Preterm labor symptoms and general obstetric precautions including but not limited to vaginal bleeding, contractions, leaking of fluid and fetal movement were reviewed in detail with the patient.  All questions were answered. Has home bp cuff.  Check bp weekly, let us know if >140/90.   Follow-up: Return in about 2 weeks (around 08/04/2021) for LROB, in person, [place]: EFW.  Orders Placed This Encounter  Procedures   US OB Follow Up   Arabella Merles [facility] 07/21/2021 4:30 PM

## 2021-07-21 NOTE — Patient Instructions (Signed)
Julie Atkins, thank you for choosing our office today! We appreciate the opportunity to meet your healthcare needs. You may receive a short survey by mail, e-mail, or through MyChart. If you are happy with your care we would appreciate if you could take just a few minutes to complete the survey questions. We read all of your comments and take your feedback very seriously. Thank you again for choosing our office.  [facility] ([address] [place], [place] [address]) Entrance C, located off of [address] Free 24/7 valet parking   CLASSES: Go to [email or web address] to register for classes (childbirth, breastfeeding, waterbirth, infant CPR, daddy bootcamp, etc.)  Call the office ([phone]) or go to [facility] if: You begin to have strong, frequent contractions Your water breaks.  Sometimes it is a big gush of fluid, sometimes it is just a trickle that keeps getting your panties wet or running down your legs You have vaginal bleeding.  It is normal to have a small amount of spotting if your cervix was checked.  You don't feel your baby moving like normal.  If you don't, get you something to eat and drink and lay down and focus on feeling your baby move.   If your baby is still not moving like normal, you should call the office or go to [facility].  Call the office ([phone]) or go to [facility] for these signs of pre-eclampsia: Severe headache that does not go away with Tylenol Visual changes- seeing spots, double, blurred vision Pain under your right breast or upper abdomen that does not go away with Tums or heartburn medicine Nausea and/or vomiting Severe swelling in your hands, feet, and face   Tdap Vaccine It is recommended that you get the Tdap vaccine during the third trimester of EACH pregnancy to help protect your baby from getting pertussis (whooping cough) 27-36 weeks is the BEST time to do  this so that you can pass the protection on to your baby. During pregnancy is better than after pregnancy, but if you are unable to get it during pregnancy it will be offered at the hospital.  You can get this vaccine with us, at the health department, your family doctor, or some local pharmacies Everyone who will be around your baby should also be up-to-date on their vaccines before the baby comes. Adults (who are not pregnant) only need 1 dose of Tdap during adulthood.   [place] Pediatricians/Family Doctors [facility] ([facility]): [address], [phone]           [facility]: [address], [phone]                [facility] ([facility]): [address], [phone] (call to ask if accepting patients) [facility]: [address], [place], [phone]    [place] Pediatricians/Family Doctors [facility] ([facility]): [address], [phone] [facility]: [address], [phone] [facility]: [address], [phone]  [place] [facility]  [facility] ([facility]): [phone] [facility]: [address], [phone]   [place] [facility]: [address], [phone]  [place] [facility]  [facility]: [address], [phone]  Home Blood Pressure Monitoring for Patients   Your provider has recommended that you check your   blood pressure (BP) at least once a week at home. If you do not have a blood pressure cuff at home, one will be provided for you. Contact your provider if you have not received your monitor within 1 week.   Helpful Tips for Accurate Home Blood Pressure Checks  Don't smoke, exercise, or drink caffeine 30 minutes before checking your BP Use the restroom before checking your BP (a full bladder can raise your  pressure) Relax in a comfortable upright chair Feet on the ground Left arm resting comfortably on a flat surface at the level of your heart Legs uncrossed Back supported Sit quietly and don't talk Place the cuff on your bare arm Adjust snuggly, so that only two fingertips can fit between your skin and the top of the cuff Check 2 readings separated by at least one minute Keep a log of your BP readings For a visual, please reference this diagram: http://ccnc.care/bpdiagram  Provider Name: [facility]     Phone: [phone]  Zone 1: ALL CLEAR  Continue to monitor your symptoms:  BP reading is less than 140 (top number) or less than 90 (bottom number)  No right upper stomach pain No headaches or seeing spots No feeling nauseated or throwing up No swelling in face and hands  Zone 2: CAUTION Call your doctor's office for any of the following:  BP reading is greater than 140 (top number) or greater than 90 (bottom number)  Stomach pain under your ribs in the middle or right side Headaches or seeing spots Feeling nauseated or throwing up Swelling in face and hands  Zone 3: EMERGENCY  Seek immediate medical care if you have any of the following:  BP reading is greater than160 (top number) or greater than 110 (bottom number) Severe headaches not improving with Tylenol Serious difficulty catching your breath Any worsening symptoms from Zone 2   Third Trimester of Pregnancy The third trimester is from week 29 through week 42, months 7 through 9. The third trimester is a time when the fetus is growing rapidly. At the end of the ninth month, the fetus is about 20 inches in length and weighs 6-10 pounds.  BODY CHANGES Your body goes through many changes during pregnancy. The changes vary from woman to woman.  Your weight will continue to increase. You can expect to gain 25-35 pounds (11-16 kg) by the end of the pregnancy. You may begin to get stretch marks on your hips, abdomen,  and breasts. You may urinate more often because the fetus is moving lower into your pelvis and pressing on your bladder. You may develop or continue to have heartburn as a result of your pregnancy. You may develop constipation because certain hormones are causing the muscles that push waste through your intestines to slow down. You may develop hemorrhoids or swollen, bulging veins (varicose veins). You may have pelvic pain because of the weight gain and pregnancy hormones relaxing your joints between the bones in your pelvis. Backaches may result from overexertion of the muscles supporting your posture. You may have changes in your hair. These can include thickening of your hair, rapid growth, and changes in texture. Some women also have hair loss during or after pregnancy, or hair that feels dry or thin. Your hair will most likely return to normal after your baby is born. Your breasts will continue to grow and be tender. A yellow discharge may leak from your breasts called colostrum. Your belly button may stick out. You may   feel short of breath because of your expanding uterus. You may notice the fetus "dropping," or moving lower in your abdomen. You may have a bloody mucus discharge. This usually occurs a few days to a week before labor begins. Your cervix becomes thin and soft (effaced) near your due date. WHAT TO EXPECT AT YOUR PRENATAL EXAMS  You will have prenatal exams every 2 weeks until week 36. Then, you will have weekly prenatal exams. During a routine prenatal visit: You will be weighed to make sure you and the fetus are growing normally. Your blood pressure is taken. Your abdomen will be measured to track your baby's growth. The fetal heartbeat will be listened to. Any test results from the previous visit will be discussed. You may have a cervical check near your due date to see if you have effaced. At around 36 weeks, your caregiver will check your cervix. At the same time, your  caregiver will also perform a test on the secretions of the vaginal tissue. This test is to determine if a type of bacteria, Group B streptococcus, is present. Your caregiver will explain this further. Your caregiver may ask you: What your birth plan is. How you are feeling. If you are feeling the baby move. If you have had any abnormal symptoms, such as leaking fluid, bleeding, severe headaches, or abdominal cramping. If you have any questions. Other tests or screenings that may be performed during your third trimester include: Blood tests that check for low iron levels (anemia). Fetal testing to check the health, activity level, and growth of the fetus. Testing is done if you have certain medical conditions or if there are problems during the pregnancy. FALSE LABOR You may feel small, irregular contractions that eventually go away. These are called Braxton Hicks contractions, or false labor. Contractions may last for hours, days, or even weeks before true labor sets in. If contractions come at regular intervals, intensify, or become painful, it is best to be seen by your caregiver.  SIGNS OF LABOR  Menstrual-like cramps. Contractions that are 5 minutes apart or less. Contractions that start on the top of the uterus and spread down to the lower abdomen and back. A sense of increased pelvic pressure or back pain. A watery or bloody mucus discharge that comes from the vagina. If you have any of these signs before the 37th week of pregnancy, call your caregiver right away. You need to go to the hospital to get checked immediately. HOME CARE INSTRUCTIONS  Avoid all smoking, herbs, alcohol, and unprescribed drugs. These chemicals affect the formation and growth of the baby. Follow your caregiver's instructions regarding medicine use. There are medicines that are either safe or unsafe to take during pregnancy. Exercise only as directed by your caregiver. Experiencing uterine cramps is a good sign to  stop exercising. Continue to eat regular, healthy meals. Wear a good support bra for breast tenderness. Do not use hot tubs, steam rooms, or saunas. Wear your seat belt at all times when driving. Avoid raw meat, uncooked cheese, cat litter boxes, and soil used by cats. These carry germs that can cause birth defects in the baby. Take your prenatal vitamins. Try taking a stool softener (if your caregiver approves) if you develop constipation. Eat more high-fiber foods, such as fresh vegetables or fruit and whole grains. Drink plenty of fluids to keep your urine clear or pale yellow. Take warm sitz baths to soothe any pain or discomfort caused by hemorrhoids. Use hemorrhoid cream if   your caregiver approves. If you develop varicose veins, wear support hose. Elevate your feet for 15 minutes, 3-4 times a day. Limit salt in your diet. Avoid heavy lifting, wear low heal shoes, and practice good posture. Rest a lot with your legs elevated if you have leg cramps or low back pain. Visit your dentist if you have not gone during your pregnancy. Use a soft toothbrush to brush your teeth and be gentle when you floss. A sexual relationship may be continued unless your caregiver directs you otherwise. Do not travel far distances unless it is absolutely necessary and only with the approval of your caregiver. Take prenatal classes to understand, practice, and ask questions about the labor and delivery. Make a trial run to the hospital. Pack your hospital bag. Prepare the baby's nursery. Continue to go to all your prenatal visits as directed by your caregiver. SEEK MEDICAL CARE IF: You are unsure if you are in labor or if your water has broken. You have dizziness. You have mild pelvic cramps, pelvic pressure, or nagging pain in your abdominal area. You have persistent nausea, vomiting, or diarrhea. You have a bad smelling vaginal discharge. You have pain with urination. SEEK IMMEDIATE MEDICAL CARE IF:  You  have a fever. You are leaking fluid from your vagina. You have spotting or bleeding from your vagina. You have severe abdominal cramping or pain. You have rapid weight loss or gain. You have shortness of breath with chest pain. You notice sudden or extreme swelling of your face, hands, ankles, feet, or legs. You have not felt your baby move in over an hour. You have severe headaches that do not go away with medicine. You have vision changes. Document Released: 05/17/2001 Document Revised: 05/28/2013 Document Reviewed: 07/24/2012 ExitCare Patient Information 2015 [employer]. This information is not intended to replace advice given to you by your health care provider. Make sure you discuss any questions you have with your health care provider.       

## 2021-08-09 ENCOUNTER — Other Ambulatory Visit: Payer: Self-pay

## 2021-08-09 ENCOUNTER — Ambulatory Visit (INDEPENDENT_AMBULATORY_CARE_PROVIDER_SITE_OTHER): Payer: Medicaid Other

## 2021-08-09 ENCOUNTER — Encounter: Payer: Self-pay | Admitting: Women's Health

## 2021-08-09 ENCOUNTER — Ambulatory Visit (INDEPENDENT_AMBULATORY_CARE_PROVIDER_SITE_OTHER): Payer: Medicaid Other | Admitting: Women's Health

## 2021-08-09 VITALS — BP 126/73 | HR 89 | Wt 214.0 lb

## 2021-08-09 DIAGNOSIS — O26843 Uterine size-date discrepancy, third trimester: Secondary | ICD-10-CM

## 2021-08-09 DIAGNOSIS — Z3483 Encounter for supervision of other normal pregnancy, third trimester: Secondary | ICD-10-CM

## 2021-08-09 DIAGNOSIS — Z3480 Encounter for supervision of other normal pregnancy, unspecified trimester: Secondary | ICD-10-CM

## 2021-08-09 NOTE — Progress Notes (Signed)
? ? ?LOW-RISK PREGNANCY VISIT ?Patient name: Julie Atkins MRN [id number]  Date of birth: 11/18/1995 ?Chief Complaint:   ?Routine Prenatal Visit ? ?History of Present Illness:   ?Julie Atkins is a 26 y.o. G76P2002 female at [redacted]w[redacted]d with an Estimated Date of Delivery: 09/07/21 being seen today for ongoing management of a low-risk pregnancy.  ? ?Today she reports no complaints.  .  .   . denies leaking of fluid. ? ?Depression screen [facility] 2/9 03/24/2021 09/19/2018 06/29/2018  ?Decreased Interest 0 0 0  ?Down, Depressed, Hopeless 0 0 0  ?PHQ - 2 Score 0 0 0  ?Altered sleeping 0 0 -  ?Tired, decreased energy 1 0 -  ?Change in appetite 1 0 -  ?Feeling bad or failure about yourself  0 0 -  ?Trouble concentrating 0 0 -  ?Moving slowly or fidgety/restless 0 0 -  ?Suicidal thoughts 0 0 -  ?PHQ-9 Score 2 0 -  ? ?  ?GAD 7 : Generalized Anxiety Score 03/24/2021  ?Nervous, Anxious, on Edge 0  ?Control/stop worrying 0  ?Worry too much - different things 0  ?Trouble relaxing 0  ?Restless 0  ?Easily annoyed or irritable 0  ?Afraid - awful might happen 0  ?Total GAD 7 Score 0  ? ? ?  ?Review of Systems:   ?Pertinent items are noted in HPI ?Denies abnormal vaginal discharge w/ itching/odor/irritation, headaches, visual changes, shortness of breath, chest pain, abdominal pain, severe nausea/vomiting, or problems with urination or bowel movements unless otherwise stated above. ?Pertinent History Reviewed:  ?Reviewed past medical,surgical, social, obstetrical and family history.  ?Reviewed problem list, medications and allergies. ?Physical Assessment:  ? ?Vitals:  ? 08/09/21 1555  ?BP: 126/73  ?Pulse: 89  ?Weight: 214 lb (97.1 kg)  ?Body mass index is 40.43 kg/m?. ?  ?     Physical Examination:  ? General appearance: Well appearing, and in no distress ? Mental status: Alert, oriented to person, place, and time ? Skin: Warm & dry ? Cardiovascular: Normal heart rate noted ? Respiratory: Normal respiratory effort, no distress ? Abdomen: Soft,  gravid, nontender ? Pelvic: Cervical exam deferred        ? Extremities:   ? ?Fetal Status: Fetal Heart Rate (bpm): 132 u/s      [place] 35+6 wks,cephalic,anterior fundal placenta gr 2,cx 4.4 cm,AFI 20 cm,FHR 132 BPM,EFW 3054 g 77%,AC 97% (done for s<d last visit, and efw 18%  ? ?Chaperone: N/A   ?No results found for this or any previous visit (from the past 24 hour(s)).  ?Assessment & Plan:  ?1) Low-risk pregnancy G3P2002 at [redacted]w[redacted]d with an Estimated Date of Delivery: 09/07/21  ?  ?Meds: No orders of the defined types were placed in this encounter. ? ?Labs/procedures today: U/S ? ?Plan:  Continue routine obstetrical care  ?Next visit: prefers will be in person for cultures    ? ?Reviewed: Preterm labor symptoms and general obstetric precautions including but not limited to vaginal bleeding, contractions, leaking of fluid and fetal movement were reviewed in detail with the patient.  All questions were answered. Does have home bp cuff. Office bp cuff given: not applicable. Check bp weekly, let us know if consistently >140 and/or >90. ? ?Follow-up: Return for LROB, weekly, CNM, in person. ? ?Future Appointments  ?Date Time Provider Department Center  ?08/16/2021  4:10 PM Eure, Amaryllis Dyke, MD [facility]  ?08/23/2021  4:10 PM Jacklyn Shell, CNM [facility]  ?08/30/2021  4:10 PM Cheral Marker, CNM [facility]  ?  09/06/2021  4:10 PM Eure, Amaryllis Dyke, MD [facility]  ? ? ?No orders of the defined types were placed in this encounter. ? ?Cheral Marker CNM, WHNP-BC ?08/09/2021 ?4:24 PM  ?

## 2021-08-09 NOTE — Patient Instructions (Signed)
[place], thank you for choosing our office today! We appreciate the opportunity to meet your healthcare needs. You may receive a short survey by mail, e-mail, or through MyChart. If you are happy with your care we would appreciate if you could take just a few minutes to complete the survey questions. We read all of your comments and take your feedback very seriously. Thank you again for choosing our office.  [facility] ([address] [place], [place] [address]) Entrance C, located off of [address] Free 24/7 valet parking   CLASSES: Go to [email or web address] to register for classes (childbirth, breastfeeding, waterbirth, infant CPR, daddy bootcamp, etc.)  Call the office ([phone]) or go to [facility] if: You begin to have strong, frequent contractions Your water breaks.  Sometimes it is a big gush of fluid, sometimes it is just a trickle that keeps getting your panties wet or running down your legs You have vaginal bleeding.  It is normal to have a small amount of spotting if your cervix was checked.  You don't feel your baby moving like normal.  If you don't, get you something to eat and drink and lay down and focus on feeling your baby move.   If your baby is still not moving like normal, you should call the office or go to [facility].  Call the office ([phone]) or go to [facility] for these signs of pre-eclampsia: Severe headache that does not go away with Tylenol Visual changes- seeing spots, double, blurred vision Pain under your right breast or upper abdomen that does not go away with Tums or heartburn medicine Nausea and/or vomiting Severe swelling in your hands, feet, and face   Tdap Vaccine It is recommended that you get the Tdap vaccine during the third trimester of EACH pregnancy to help protect your baby from getting pertussis (whooping cough) 27-36 weeks is the BEST time to do  this so that you can pass the protection on to your baby. During pregnancy is better than after pregnancy, but if you are unable to get it during pregnancy it will be offered at the hospital.  You can get this vaccine with us, at the health department, your family doctor, or some local pharmacies Everyone who will be around your baby should also be up-to-date on their vaccines before the baby comes. Adults (who are not pregnant) only need 1 dose of Tdap during adulthood.   [place] Pediatricians/Family Doctors [facility] ([facility]): [address], [phone]           [facility]: [address], [phone]                [facility] ([facility]): [address], [phone] (call to ask if accepting patients) [facility]: [address], [place], [phone]    [place] Pediatricians/Family Doctors [facility] ([facility]): [address], [phone] [facility]: [address], [phone] [facility]: [address], [phone]  [place] [facility]  [facility] ([facility]): [phone] [facility]: [address], [phone]   [place] [facility]: [address], [phone]  [place] [facility]  [facility]: [address], [phone]  Home Blood Pressure Monitoring for Patients   Your provider has recommended that you check your   blood pressure (BP) at least once a week at home. If you do not have a blood pressure cuff at home, one will be provided for you. Contact your provider if you have not received your monitor within 1 week.  ° °Helpful Tips for Accurate Home Blood Pressure Checks  °Don't smoke, exercise, or drink caffeine 30 minutes before checking your BP °Use the restroom before checking your BP (a full bladder can raise your  pressure) °Relax in a comfortable upright chair °Feet on the ground °Left arm resting comfortably on a flat surface at the level of your heart °Legs uncrossed °Back supported °Sit quietly and don't talk °Place the cuff on your bare arm °Adjust snuggly, so that only two fingertips can fit between your skin and the top of the cuff °Check 2 readings separated by at least one minute °Keep a log of your BP readings °For a visual, please reference this diagram: http://ccnc.care/bpdiagram ° °Provider Name: [facility]     Phone: [phone] ° °Zone 1: ALL CLEAR  °Continue to monitor your symptoms:  °BP reading is less than 140 (top number) or less than 90 (bottom number)  °No right upper stomach pain °No headaches or seeing spots °No feeling nauseated or throwing up °No swelling in face and hands ° °Zone 2: CAUTION °Call your doctor's office for any of the following:  °BP reading is greater than 140 (top number) or greater than 90 (bottom number)  °Stomach pain under your ribs in the middle or right side °Headaches or seeing spots °Feeling nauseated or throwing up °Swelling in face and hands ° °Zone 3: EMERGENCY  °Seek immediate medical care if you have any of the following:  °BP reading is greater than160 (top number) or greater than 110 (bottom number) °Severe headaches not improving with Tylenol °Serious difficulty catching your breath °Any worsening symptoms from Zone 2  °Preterm Labor and Birth Information ° °The normal length of a pregnancy is 39-41 weeks. Preterm labor is when labor starts before 37 completed weeks of pregnancy. °What are the risk factors for preterm labor? °Preterm labor is more likely to occur in women who: °Have certain infections during pregnancy such as a bladder infection, sexually transmitted infection, or infection inside the uterus (chorioamnionitis). °Have a shorter-than-normal cervix. °Have gone into preterm labor before. °Have had surgery on their cervix. °Are younger than age 17  or older than age 35. °Are African American. °Are pregnant with twins or multiple babies (multiple gestation). °Take street drugs or smoke while pregnant. °Do not gain enough weight while pregnant. °Became pregnant shortly after having been pregnant. °What are the symptoms of preterm labor? °Symptoms of preterm labor include: °Cramps similar to those that can happen during a menstrual period. The cramps may happen with diarrhea. °Pain in the abdomen or lower back. °Regular uterine contractions that may feel like tightening of the abdomen. °A feeling of increased pressure in the pelvis. °Increased watery or bloody mucus discharge from the vagina. °Water breaking (ruptured amniotic sac). °Why is it important to recognize signs of preterm labor? °It is important to recognize signs of preterm labor because babies who are born prematurely may not be fully developed. This can put them at an increased risk for: °Long-term (chronic) heart and lung problems. °Difficulty immediately after birth with regulating body systems, including blood sugar, body temperature, heart rate, and breathing rate. °Bleeding in the brain. °Cerebral palsy. °Learning difficulties. °Death. °These risks are highest for babies who are born before 34 weeks   of pregnancy. How is preterm labor treated? Treatment depends on the length of your pregnancy, your condition, and the health of your baby. It may involve: Having a stitch (suture) placed in your cervix to prevent your cervix from opening too early (cerclage). Taking or being given medicines, such as: Hormone medicines. These may be given early in pregnancy to help support the pregnancy. Medicine to stop contractions. Medicines to help mature the babys lungs. These may be prescribed if the risk of delivery is high. Medicines to prevent your baby from developing cerebral palsy. If the labor happens before 34 weeks of pregnancy, you may need to stay in the hospital. What should I do if I  think I am in preterm labor? If you think that you are going into preterm labor, call your health care provider right away. How can I prevent preterm labor in future pregnancies? To increase your chance of having a full-term pregnancy: Do not use any tobacco products, such as cigarettes, chewing tobacco, and e-cigarettes. If you need help quitting, ask your health care provider. Do not use street drugs or medicines that have not been prescribed to you during your pregnancy. Talk with your health care provider before taking any herbal supplements, even if you have been taking them regularly. Make sure you gain a healthy amount of weight during your pregnancy. Watch for infection. If you think that you might have an infection, get it checked right away. Make sure to tell your health care provider if you have gone into preterm labor before. This information is not intended to replace advice given to you by your health care provider. Make sure you discuss any questions you have with your health care provider. Document Revised: 09/14/2018 Document Reviewed: 10/14/2015 Elsevier Patient Education  [place].

## 2021-08-09 NOTE — Progress Notes (Signed)
[place] [id number] wks,cephalic,anterior fundal placenta gr 2,cx 4.4 cm,AFI 20 cm,FHR 132 BPM,EFW 3054 g 77%,AC 97% ?

## 2021-08-10 NOTE — Addendum Note (Signed)
Addended by: Cheral Marker on: 08/10/2021 08:34 AM ? ? Modules accepted: Orders ? ?

## 2021-08-16 ENCOUNTER — Encounter: Payer: Self-pay | Admitting: Obstetrics & Gynecology

## 2021-08-16 ENCOUNTER — Other Ambulatory Visit: Payer: Self-pay

## 2021-08-16 ENCOUNTER — Ambulatory Visit (INDEPENDENT_AMBULATORY_CARE_PROVIDER_SITE_OTHER): Payer: Medicaid Other | Admitting: Obstetrics & Gynecology

## 2021-08-16 ENCOUNTER — Other Ambulatory Visit (HOSPITAL_COMMUNITY)
Admission: RE | Admit: 2021-08-16 | Discharge: 2021-08-16 | Disposition: A | Discharge status: Home / Self Care | Payer: Medicaid Other | Source: Ambulatory Visit | Attending: Obstetrics & Gynecology | Admitting: Obstetrics & Gynecology

## 2021-08-16 VITALS — BP 127/73 | HR 117 | Wt 215.0 lb

## 2021-08-16 DIAGNOSIS — Z3A36 36 weeks gestation of pregnancy: Secondary | ICD-10-CM | POA: Insufficient documentation

## 2021-08-16 DIAGNOSIS — Z3483 Encounter for supervision of other normal pregnancy, third trimester: Secondary | ICD-10-CM

## 2021-08-16 LAB — OB RESULTS CONSOLE GC/CHLAMYDIA: Gonorrhea: NEGATIVE

## 2021-08-16 NOTE — Progress Notes (Signed)
? ?  LOW-RISK PREGNANCY VISIT ?Patient name: Julie Atkins MRN [id number]  Date of birth: [birth year] ?Chief Complaint:   ?Routine Prenatal Visit ? ?History of Present Illness:   ?Gilberto Celentano is a 26 y.o. G32P2002 female at [redacted]w[redacted]d with an Estimated Date of Delivery: 09/07/21 being seen today for ongoing management of a low-risk pregnancy.  ?Depression screen [facility] 2/9 03/24/2021 09/19/2018 06/29/2018  ?Decreased Interest 0 0 0  ?Down, Depressed, Hopeless 0 0 0  ?PHQ - 2 Score 0 0 0  ?Altered sleeping 0 0 -  ?Tired, decreased energy 1 0 -  ?Change in appetite 1 0 -  ?Feeling bad or failure about yourself  0 0 -  ?Trouble concentrating 0 0 -  ?Moving slowly or fidgety/restless 0 0 -  ?Suicidal thoughts 0 0 -  ?PHQ-9 Score 2 0 -  ? ? ?Today she reports no complaints. Contractions: Not present. Vag. Bleeding: None.  Movement: Present. denies leaking of fluid. ?Review of Systems:   ?Pertinent items are noted in HPI ?Denies abnormal vaginal discharge w/ itching/odor/irritation, headaches, visual changes, shortness of breath, chest pain, abdominal pain, severe nausea/vomiting, or problems with urination or bowel movements unless otherwise stated above. ?Pertinent History Reviewed:  ?Reviewed past medical,surgical, social, obstetrical and family history.  ?Reviewed problem list, medications and allergies. ?Physical Assessment:  ? ?Vitals:  ? 08/16/21 1559  ?BP: 127/73  ?Pulse: (!) 117  ?Weight: 215 lb (97.5 kg)  ?Body mass index is 40.62 kg/m?. ?  ?     Physical Examination:  ? General appearance: Well appearing, and in no distress ? Mental status: Alert, oriented to person, place, and time ? Skin: Warm & dry ? Cardiovascular: Normal heart rate noted ? Respiratory: Normal respiratory effort, no distress ? Abdomen: Soft, gravid, nontender ? Pelvic: Cervical exam deferred        ? Extremities: Edema: None ? ?Fetal Status: Fetal Heart Rate (bpm): 156 Fundal Height: 37 cm Movement: Present   ? ?Chaperone: n/a   ? ?No results found  for this or any previous visit (from the past 24 hour(s)).  ?Assessment & Plan:  ?1) Low-risk pregnancy G3P2002 at [redacted]w[redacted]d with an Estimated Date of Delivery: 09/07/21  ? ?  ?Meds: No orders of the defined types were placed in this encounter. ? ?Labs/procedures today: cultures ? ?Plan:  Continue routine obstetrical care  ?Next visit: prefers in person   ? ?Reviewed: Term labor symptoms and general obstetric precautions including but not limited to vaginal bleeding, contractions, leaking of fluid and fetal movement were reviewed in detail with the patient.  All questions were answered. Has home bp cuff. Rx faxed to . Check bp weekly, let us know if >140/90.  ? ?Follow-up: Return in about 1 week (around 08/23/2021) for [place]. ? ?Orders Placed This Encounter  ?Procedures  ? Culture, beta strep (group b only)  ? ? ?Florian Buff, MD ?08/16/2021 ?4:35 PM ? ?

## 2021-08-18 LAB — CERVICOVAGINAL ANCILLARY ONLY
Chlamydia: NEGATIVE
Comment: NEGATIVE
Comment: NORMAL
Neisseria Gonorrhea: NEGATIVE

## 2021-08-20 LAB — CULTURE, BETA STREP (GROUP B ONLY): Strep Gp B Culture: NEGATIVE

## 2021-08-23 ENCOUNTER — Other Ambulatory Visit: Payer: Self-pay

## 2021-08-23 ENCOUNTER — Ambulatory Visit (INDEPENDENT_AMBULATORY_CARE_PROVIDER_SITE_OTHER): Payer: Medicaid Other | Admitting: Advanced Practice Midwife

## 2021-08-23 ENCOUNTER — Encounter: Payer: Self-pay | Admitting: Advanced Practice Midwife

## 2021-08-23 VITALS — BP 124/74 | HR 93 | Wt 214.2 lb

## 2021-08-23 DIAGNOSIS — Z3480 Encounter for supervision of other normal pregnancy, unspecified trimester: Secondary | ICD-10-CM

## 2021-08-23 DIAGNOSIS — Z3A37 37 weeks gestation of pregnancy: Secondary | ICD-10-CM

## 2021-08-23 NOTE — Progress Notes (Signed)
? ?  LOW-RISK PREGNANCY VISIT ?Patient name: Julie Atkins MRN [id number]  Date of birth: 02-06-1996 ?Chief Complaint:   ?Routine Prenatal Visit ? ?History of Present Illness:   ?Julie Atkins is a 26 y.o. G59P2002 female at [redacted]w[redacted]d with an Estimated Date of Delivery: 09/07/21 being seen today for ongoing management of a low-risk pregnancy.  ?Today she reports no complaints. Contractions: Irregular.  .  Movement: Present. denies leaking of fluid. ?Review of Systems:   ?Pertinent items are noted in HPI ?Denies abnormal vaginal discharge w/ itching/odor/irritation, headaches, visual changes, shortness of breath, chest pain, abdominal pain, severe nausea/vomiting, or problems with urination or bowel movements unless otherwise stated above. ?Pertinent History Reviewed:  ?Reviewed past medical,surgical, social, obstetrical and family history.  ?Reviewed problem list, medications and allergies. ?Physical Assessment:  ? ?Vitals:  ? 08/23/21 1553  ?BP: 124/74  ?Pulse: 93  ?Weight: 214 lb 3.2 oz (97.2 kg)  ?Body mass index is 40.47 kg/m?. ?  ?     Physical Examination:  ? General appearance: Well appearing, and in no distress ? Mental status: Alert, oriented to person, place, and time ? Skin: Warm & dry ? Cardiovascular: Normal heart rate noted ? Respiratory: Normal respiratory effort, no distress ? Abdomen: Soft, gravid, nontender ? Pelvic: Cervical exam deferred        ? Extremities: Edema: None ? ?Fetal Status: Fetal Heart Rate (bpm): 150 Fundal Height: 38 cm Movement: Present   ? ?Chaperone: n/a   ? ?No results found for this or any previous visit (from the past 24 hour(s)).  ?Assessment & Plan:  ?1) Low-risk pregnancy G3P2002 at [redacted]w[redacted]d with an Estimated Date of Delivery: 09/07/21  ?2)  Baby breech, head at maternal left.  Wants ECV/IOL at 39 weeks.  Scheduled for 3/29 ? ?  ?Meds: No orders of the defined types were placed in this encounter. ? ?Labs/procedures today:  ? ?Plan:  Continue routine obstetrical care ,  Tips for  turning a breech given ?Next visit: prefers in person   ? ?Reviewed: Term labor symptoms and general obstetric precautions including but not limited to vaginal bleeding, contractions, leaking of fluid and fetal movement were reviewed in detail with the patient.  All questions were answered. Has home bp cuff. Check bp weekly, let us know if >140/90.  ? ?Follow-up: Return in about 1 week (around 08/30/2021) for [place]. ? ?No orders of the defined types were placed in this encounter. ? ?Christin Fudge DNP, CNM ?08/23/2021 ?4:26 PM ? ?

## 2021-08-23 NOTE — Patient Instructions (Addendum)
Breech Birth °A breech birth is when a baby is born with the buttocks or feet first. Most babies are in a head down (vertex) position when they are born. There are three types of breech babies: °When the baby's buttocks are showing first in the vagina (birth canal) with the legs bent at the knees and the feet down near the buttocks (complete breech). °When the baby's buttocks are showing first in the birth canal with the legs straight up and the feet at the baby's head (frank breech). °When one or both of the baby's feet are showing first in the birth canal along with the buttocks (footling breech). °What increases the risk of having a breech baby? °It is not known what causes your baby to be breech. However, you are more likely to have a breech baby if: °You have had a previous pregnancy. °You are having more than one baby. °Your baby has certain birth (congenital) defects. °You have started your labor earlier than expected (premature labor). °You have problems with your uterus, such as a growths or an abnormally-shaped uterus. °You have too much or not enough fluid surrounding the baby (amniotic fluid). °The placenta covers all or part of the opening of the uterus (placenta previa). °How does this affect me? °There are no symptoms for you to know that your baby is breech. When you are close to your due date, your health care provider can tell if your baby is breech by doing: °An abdominal or vaginal (pelvic) exam. °An ultrasound. °You and your health care provider will discuss the best way to deliver your baby. If your baby is breech, it is less likely that a vaginal delivery will be recommended due to the risks to you and your baby. °How does this affect my baby? °Having a breech birth increases the health risks to your baby. A breech birth may cause the following: °Umbilical cord prolapse. This is when the umbilical cord enters the birth canal ahead of the baby, before or during labor. This can cause the cord to  become pinched or compressed as labor continues. This can reduce the flow of blood and oxygen to the baby. °The baby getting stuck in the birth canal, which can cause injury or, rarely, death. °Injury to the baby's nerves in the shoulder, arm, and hand (brachial plexus injury) when delivered. °How is this treated? °Your health care provider may try to turn the baby in your uterus. He or she will use a procedure called external cephalic version (ECV). He or she will place both hands on your abdomen and gently and slowly turn the baby around. It is important to know that ECV can increase your chances of suddenly going into labor. °For this reason, an ECV is only done toward the end of a healthy pregnancy. The baby may remain in this position, but sometimes he or she may turn back to the breech position. You and your health care provider will discuss if an ECV is recommended for you and your baby. °Your health care provider may recommend that you deliver your baby through a cesarean delivery (C-section). A C-section is the surgical delivery of a baby through an incision in the abdomen and the uterus. °Contact a health care provider if: °Get help right away if: °Your baby is breech and you have regular painful contractions or loss of your mucus plug (bloody show). °Your water breaks. °You see the baby's umbilical cord protruding from your vagina. °Summary °A breech birth is when a   baby is born with the buttocks or feet first. °Having a breech birth may increase the risks to your baby. °Your health care provider may try to turn your baby in your uterus using a procedure called an external cephalic version (ECV). °If your baby cannot be turned to a head down position or if your baby remains in a breech position, your health care provider will make recommendations about the safest way to deliver your baby. °This information is not intended to replace advice given to you by your health care provider. Make sure you discuss  any questions you have with your health care provider. °Document Revised: 03/09/2020 Document Reviewed: 03/09/2020 °Elsevier Patient Education © 2022 Elsevier Inc. ° °

## 2021-08-24 ENCOUNTER — Encounter (HOSPITAL_COMMUNITY): Payer: Self-pay | Admitting: *Deleted

## 2021-08-24 ENCOUNTER — Telehealth (HOSPITAL_COMMUNITY): Payer: Self-pay | Admitting: *Deleted

## 2021-08-24 ENCOUNTER — Encounter: Payer: Self-pay | Admitting: Advanced Practice Midwife

## 2021-08-24 NOTE — Telephone Encounter (Signed)
Preadmission screen  

## 2021-08-25 ENCOUNTER — Other Ambulatory Visit: Payer: Self-pay | Admitting: Advanced Practice Midwife

## 2021-08-30 ENCOUNTER — Other Ambulatory Visit: Payer: Self-pay

## 2021-08-30 ENCOUNTER — Encounter: Payer: Self-pay | Admitting: Women's Health

## 2021-08-30 ENCOUNTER — Ambulatory Visit (INDEPENDENT_AMBULATORY_CARE_PROVIDER_SITE_OTHER): Payer: Medicaid Other | Admitting: Women's Health

## 2021-08-30 VITALS — BP 117/68 | HR 104 | Wt 214.0 lb

## 2021-08-30 DIAGNOSIS — Z3483 Encounter for supervision of other normal pregnancy, third trimester: Secondary | ICD-10-CM

## 2021-08-30 NOTE — Progress Notes (Signed)
? ? ?LOW-RISK PREGNANCY VISIT ?Patient name: Julie Atkins MRN [id number]  Date of birth: 1995/12/22 ?Chief Complaint:   ?Routine Prenatal Visit ? ?History of Present Illness:   ?Julie Atkins is a 26 y.o. G48P2002 female at [redacted]w[redacted]d with an Estimated Date of Delivery: 09/07/21 being seen today for ongoing management of a low-risk pregnancy.  ? ?Today she reports no complaints. Contractions: Not present. Vag. Bleeding: None.  Movement: Present. denies leaking of fluid. ? ? ?  03/24/2021  ?  3:11 PM 09/19/2018  ? 10:50 AM 06/29/2018  ? 10:47 AM  ?Depression screen PHQ 2/9  ?Decreased Interest 0 0 0  ?Down, Depressed, Hopeless 0 0 0  ?PHQ - 2 Score 0 0 0  ?Altered sleeping 0 0   ?Tired, decreased energy 1 0   ?Change in appetite 1 0   ?Feeling bad or failure about yourself  0 0   ?Trouble concentrating 0 0   ?Moving slowly or fidgety/restless 0 0   ?Suicidal thoughts 0 0   ?PHQ-9 Score 2 0   ? ?  ? ?  03/24/2021  ?  3:11 PM  ?GAD 7 : Generalized Anxiety Score  ?Nervous, Anxious, on Edge 0  ?Control/stop worrying 0  ?Worry too much - different things 0  ?Trouble relaxing 0  ?Restless 0  ?Easily annoyed or irritable 0  ?Afraid - awful might happen 0  ?Total GAD 7 Score 0  ? ? ?  ?Review of Systems:   ?Pertinent items are noted in HPI ?Denies abnormal vaginal discharge w/ itching/odor/irritation, headaches, visual changes, shortness of breath, chest pain, abdominal pain, severe nausea/vomiting, or problems with urination or bowel movements unless otherwise stated above. ?Pertinent History Reviewed:  ?Reviewed past medical,surgical, social, obstetrical and family history.  ?Reviewed problem list, medications and allergies. ?Physical Assessment:  ? ?Vitals:  ? 08/30/21 1612  ?BP: 117/68  ?Pulse: (!) 104  ?Weight: 214 lb (97.1 kg)  ?Body mass index is 40.43 kg/m?. ?  ?     Physical Examination:  ? General appearance: Well appearing, and in no distress ? Mental status: Alert, oriented to person, place, and time ? Skin: Warm &  dry ? Cardiovascular: Normal heart rate noted ? Respiratory: Normal respiratory effort, no distress ? Abdomen: Soft, gravid, nontender ? Pelvic: Cervical exam deferred        ? Extremities: Edema: None ? ?Fetal Status: Fetal Heart Rate (bpm): 152   Movement: Present   ?Informal u/s: still breech, head maternal Lt ? ?Chaperone: N/A   ?No results found for this or any previous visit (from the past 24 hour(s)).  ?Assessment & Plan:  ?1) Low-risk pregnancy G3P2002 at [redacted]w[redacted]d with an Estimated Date of Delivery: 09/07/21  ? ?2) Breech, vtx maternal Lt, keep scheduled ECV>then IOL vs PCS on 3/29  ?  ?Meds: No orders of the defined types were placed in this encounter. ? ?Labs/procedures today: none ? ?Plan:  Continue routine obstetrical care  ?Next visit: prefers in person   ? ?Reviewed: Term labor symptoms and general obstetric precautions including but not limited to vaginal bleeding, contractions, leaking of fluid and fetal movement were reviewed in detail with the patient.  All questions were answered. Does have home bp cuff. Office bp cuff given: not applicable. Check bp weekly, let us know if consistently >140 and/or >90. ? ?Follow-up: Return for As scheduled. ? ?Future Appointments  ?Date Time Provider Department Center  ?09/01/2021  8:00 AM [facility] SCHED ROOM [facility] None  ?09/06/2021  4:10 PM  Lazaro Arms, MD [facility]  ? ? ?No orders of the defined types were placed in this encounter. ? ?Cheral Marker CNM, WHNP-BC ?08/30/2021 ?4:39 PM ? ?

## 2021-08-30 NOTE — Patient Instructions (Signed)
Julie Atkins, thank you for choosing our office today! We appreciate the opportunity to meet your healthcare needs. You may receive a short survey by mail, e-mail, or through MyChart. If you are happy with your care we would appreciate if you could take just a few minutes to complete the survey questions. We read all of your comments and take your feedback very seriously. Thank you again for choosing our office.  ?[facility] ? ?[facility] ?([address] [place], [place] [address]) ?Entrance C, located off of [address] ?Free 24/7 valet parking  ? ?CLASSES: Go to [email or web address] to register for classes (childbirth, breastfeeding, waterbirth, infant CPR, daddy bootcamp, etc.) ? ?Call the office ([phone]) or go to [facility] if: ?You begin to have strong, frequent contractions ?Your water breaks.  Sometimes it is a big gush of fluid, sometimes it is just a trickle that keeps getting your panties wet or running down your legs ?You have vaginal bleeding.  It is normal to have a small amount of spotting if your cervix was checked.  ?You don't feel your baby moving like normal.  If you don't, get you something to eat and drink and lay down and focus on feeling your baby move.   If your baby is still not moving like normal, you should call the office or go to [facility]. ? ?Call the office ([phone]) or go to [facility] for these signs of pre-eclampsia: ?Severe headache that does not go away with Tylenol ?Visual changes- seeing spots, double, blurred vision ?Pain under your right breast or upper abdomen that does not go away with Tums or heartburn medicine ?Nausea and/or vomiting ?Severe swelling in your hands, feet, and face  ? ?[place] Pediatricians/Family Doctors ?[facility] ([facility]): [address], [phone]           ?[facility]: [address], [phone]                 ? [facility] ([facility]): [address], [phone] (call to ask if accepting patients) ?[facility]: [address], [place], [phone]   ? ?[place] Pediatricians/Family Doctors ?[facility] ([facility]): [address], [phone] ?[facility]: [address], [phone] ?[facility]: [address], [phone] ? ?[place] [facility]  ?[facility] ([facility]): [phone] ?[facility]: [address], [phone]  ? ?[place] Family Doctors ?[facility]: [address], [phone] ? ?[place] [facility]  ?[facility]: [address], [phone] ? ?Home Blood Pressure Monitoring for Patients  ? ?Your provider has recommended that you check your blood pressure (BP) at least once a week at home. If you do not have a blood pressure cuff at home, one will be provided for you. Contact your provider if you have not received your monitor within 1 week.  ? ?Helpful Tips for Accurate Home Blood Pressure Checks  ?Don't smoke, exercise, or drink caffeine 30 minutes before checking your BP ?Use the restroom before checking your BP (a full bladder can raise your pressure) ?Relax in a comfortable upright chair ?Feet on the ground ?Left arm resting comfortably on a flat surface at the level of your heart ?Legs uncrossed ?Back supported ?Sit quietly and don't talk ?Place the cuff on your bare arm ?Adjust snuggly, so that only two fingertips   can fit between your skin and the top of the cuff ?Check 2 readings separated by at least one minute ?Keep a log of your BP readings ?For a visual, please reference this diagram: http://ccnc.care/bpdiagram ? ?Provider Name: [facility]     Phone: [phone] ? ?Zone 1: ALL CLEAR  ?Continue to monitor your symptoms:  ?BP reading is less than 140 (top number) or less than 90 (bottom number)  ?No right  upper stomach pain ?No headaches or seeing spots ?No feeling nauseated or throwing up ?No swelling in face and hands ? ?Zone 2: CAUTION ?Call your doctor's office for any of the following:  ?BP reading is greater than 140 (top number) or greater than 90 (bottom number)  ?Stomach pain under your ribs in the middle or right side ?Headaches or seeing spots ?Feeling nauseated or throwing up ?Swelling in face and hands ? ?Zone 3: EMERGENCY  ?Seek immediate medical care if you have any of the following:  ?BP reading is greater than160 (top number) or greater than 110 (bottom number) ?Severe headaches not improving with Tylenol ?Serious difficulty catching your breath ?Any worsening symptoms from Zone 2  ? ?Braxton Hicks Contractions ?Contractions of the uterus can occur throughout pregnancy, but they are not always a sign that you are in labor. You may have practice contractions called Braxton Hicks contractions. These false labor contractions are sometimes confused with true labor. ?What are [place] contractions? ?Braxton Hicks contractions are tightening movements that occur in the muscles of the uterus before labor. Unlike true labor contractions, these contractions do not result in opening (dilation) and thinning of the cervix. Toward the end of pregnancy (32-34 weeks), Braxton Hicks contractions can happen more often and may become stronger. These contractions are sometimes difficult to tell apart from true labor because they can be very uncomfortable. You should not feel embarrassed if you go to the hospital with false labor. ?Sometimes, the only way to tell if you are in true labor is for your health care provider to look for changes in the cervix. The health care provider will do a physical exam and may monitor your contractions. If you are not in true labor, the exam should show that your cervix is not dilating and your water has not broken. ?If there are no other health problems associated with your  pregnancy, it is completely safe for you to be sent home with false labor. You may continue to have Braxton Hicks contractions until you go into true labor. ?How to tell the difference between true labor and false labor ?True labor ?Contractions last 30-70 seconds. ?Contractions become very regular. ?Discomfort is usually felt in the top of the uterus, and it spreads to the lower abdomen and low back. ?Contractions do not go away with walking. ?Contractions usually become more intense and increase in frequency. ?The cervix dilates and gets thinner. ?False labor ?Contractions are usually shorter and not as strong as true labor contractions. ?Contractions are usually irregular. ?Contractions are often felt in the front of the lower abdomen and in the groin. ?Contractions may go away when you walk around or change positions while lying down. ?Contractions get weaker and are shorter-lasting as time goes on. ?The cervix usually does not dilate or become thin. ?Follow these instructions at home: ? ?Take over-the-counter and prescription medicines only as told by your health care provider. ?Keep up with your usual exercises and follow other instructions from your health care provider. ?Eat and drink lightly if you think  you are going into labor. ?If Braxton Hicks contractions are making you uncomfortable: ?Change your position from lying down or resting to walking, or change from walking to resting. ?Sit and rest in a tub of warm water. ?Drink enough fluid to keep your urine pale yellow. Dehydration may cause these contractions. ?Do slow and deep breathing several times an hour. ?Keep all follow-up prenatal visits as told by your health care provider. This is important. ?Contact a health care provider if: ?You have a fever. ?You have continuous pain in your abdomen. ?Get help right away if: ?Your contractions become stronger, more regular, and closer together. ?You have fluid leaking or gushing from your vagina. ?You pass  blood-tinged mucus (bloody show). ?You have bleeding from your vagina. ?You have low back pain that you never had before. ?You feel your baby?s head pushing down and causing pelvic pressure. ?Your baby is not

## 2021-09-01 ENCOUNTER — Observation Stay (HOSPITAL_COMMUNITY): Admission: RE | Admit: 2021-09-01 | Payer: Medicaid Other | Source: Ambulatory Visit | Admitting: Family Medicine

## 2021-09-01 ENCOUNTER — Encounter (HOSPITAL_COMMUNITY): Payer: Self-pay | Admitting: Obstetrics and Gynecology

## 2021-09-01 ENCOUNTER — Inpatient Hospital Stay (HOSPITAL_COMMUNITY): Payer: Medicaid Other | Admitting: Anesthesiology

## 2021-09-01 ENCOUNTER — Inpatient Hospital Stay (HOSPITAL_COMMUNITY)
Admission: AD | Admit: 2021-09-01 | Discharge: 2021-09-03 | DRG: 798 | Disposition: A | Discharge status: Home / Self Care | Payer: Medicaid Other | Attending: Obstetrics & Gynecology | Admitting: Obstetrics & Gynecology

## 2021-09-01 ENCOUNTER — Other Ambulatory Visit: Payer: Self-pay

## 2021-09-01 DIAGNOSIS — O320XX0 Maternal care for unstable lie, not applicable or unspecified: Principal | ICD-10-CM | POA: Diagnosis present

## 2021-09-01 DIAGNOSIS — O321XX0 Maternal care for breech presentation, not applicable or unspecified: Secondary | ICD-10-CM | POA: Diagnosis not present

## 2021-09-01 DIAGNOSIS — Z302 Encounter for sterilization: Secondary | ICD-10-CM

## 2021-09-01 DIAGNOSIS — D649 Anemia, unspecified: Secondary | ICD-10-CM | POA: Diagnosis not present

## 2021-09-01 DIAGNOSIS — O3483 Maternal care for other abnormalities of pelvic organs, third trimester: Secondary | ICD-10-CM | POA: Diagnosis present

## 2021-09-01 DIAGNOSIS — Z3480 Encounter for supervision of other normal pregnancy, unspecified trimester: Principal | ICD-10-CM

## 2021-09-01 DIAGNOSIS — Q992 Fragile X chromosome: Secondary | ICD-10-CM | POA: Diagnosis not present

## 2021-09-01 DIAGNOSIS — O99214 Obesity complicating childbirth: Secondary | ICD-10-CM | POA: Diagnosis present

## 2021-09-01 DIAGNOSIS — Z3A39 39 weeks gestation of pregnancy: Secondary | ICD-10-CM | POA: Diagnosis not present

## 2021-09-01 DIAGNOSIS — Z3490 Encounter for supervision of normal pregnancy, unspecified, unspecified trimester: Secondary | ICD-10-CM | POA: Diagnosis present

## 2021-09-01 DIAGNOSIS — N838 Other noninflammatory disorders of ovary, fallopian tube and broad ligament: Secondary | ICD-10-CM | POA: Diagnosis present

## 2021-09-01 DIAGNOSIS — Z148 Genetic carrier of other disease: Secondary | ICD-10-CM

## 2021-09-01 DIAGNOSIS — Z8759 Personal history of other complications of pregnancy, childbirth and the puerperium: Secondary | ICD-10-CM

## 2021-09-01 DIAGNOSIS — O9902 Anemia complicating childbirth: Secondary | ICD-10-CM | POA: Diagnosis not present

## 2021-09-01 LAB — CBC
HCT: 30.6 % — ABNORMAL LOW (ref 36.0–46.0)
Hemoglobin: 9.5 g/dL — ABNORMAL LOW (ref 12.0–15.0)
MCH: 25.1 pg — ABNORMAL LOW (ref 26.0–34.0)
MCHC: 31 g/dL (ref 30.0–36.0)
MCV: 80.7 fL (ref 80.0–100.0)
Platelets: 323 10*3/uL (ref 150–400)
RBC: 3.79 MIL/uL — ABNORMAL LOW (ref 3.87–5.11)
RDW: 14.8 % (ref 11.5–15.5)
WBC: 8.6 10*3/uL (ref 4.0–10.5)
nRBC: 0 % (ref 0.0–0.2)

## 2021-09-01 LAB — TYPE AND SCREEN
ABO/RH(D): AB POS
Antibody Screen: NEGATIVE

## 2021-09-01 LAB — RPR: RPR Ser Ql: NONREACTIVE

## 2021-09-01 MED ORDER — MISOPROSTOL 50MCG HALF TABLET
50.0000 ug | ORAL_TABLET | ORAL | Status: DC | PRN
  Administered 2021-09-01 (×2): 50 ug via BUCCAL
  Filled 2021-09-01 (×2): qty 1

## 2021-09-01 MED ORDER — ACETAMINOPHEN 325 MG PO TABS
650.0000 mg | ORAL_TABLET | ORAL | Status: DC | PRN
  Administered 2021-09-02: 650 mg via ORAL
  Filled 2021-09-01: qty 2

## 2021-09-01 MED ORDER — TERBUTALINE SULFATE 1 MG/ML IJ SOLN: 0.2500 mg | Freq: Once | INTRAMUSCULAR | Status: DC

## 2021-09-01 MED ORDER — TERBUTALINE SULFATE 1 MG/ML IJ SOLN
INTRAMUSCULAR | Status: AC
  Filled 2021-09-01: qty 1

## 2021-09-01 MED ORDER — PHENYLEPHRINE 40 MCG/ML (10ML) SYRINGE FOR IV PUSH (FOR BLOOD PRESSURE SUPPORT)
80.0000 ug | PREFILLED_SYRINGE | INTRAVENOUS | Status: DC | PRN
  Filled 2021-09-01: qty 10

## 2021-09-01 MED ORDER — LIDOCAINE HCL (PF) 1 % IJ SOLN: 30.0000 mL | INTRAMUSCULAR | Status: DC | PRN

## 2021-09-01 MED ORDER — FENTANYL-BUPIVACAINE-NACL 0.5-0.125-0.9 MG/250ML-% EP SOLN
12.0000 mL/h | EPIDURAL | Status: DC | PRN
  Administered 2021-09-01: 12 mL/h via EPIDURAL
  Filled 2021-09-01: qty 250

## 2021-09-01 MED ORDER — LACTATED RINGERS IV SOLN
500.0000 mL | Freq: Once | INTRAVENOUS | Status: AC
  Administered 2021-09-01: 500 mL via INTRAVENOUS

## 2021-09-01 MED ORDER — TERBUTALINE SULFATE 1 MG/ML IJ SOLN: 0.2500 mg | Freq: Once | INTRAMUSCULAR | Status: DC | PRN

## 2021-09-01 MED ORDER — DIPHENHYDRAMINE HCL 50 MG/ML IJ SOLN: 12.5000 mg | INTRAMUSCULAR | Status: DC | PRN

## 2021-09-01 MED ORDER — FENTANYL-BUPIVACAINE-NACL 0.5-0.125-0.9 MG/250ML-% EP SOLN: 12.0000 mL/h | EPIDURAL | Status: DC | PRN

## 2021-09-01 MED ORDER — OXYCODONE-ACETAMINOPHEN 5-325 MG PO TABS: 2.0000 | ORAL_TABLET | ORAL | Status: DC | PRN

## 2021-09-01 MED ORDER — EPHEDRINE 5 MG/ML INJ: 10.0000 mg | INTRAVENOUS | Status: DC | PRN

## 2021-09-01 MED ORDER — ONDANSETRON HCL 4 MG/2ML IJ SOLN: 4.0000 mg | Freq: Four times a day (QID) | INTRAMUSCULAR | Status: DC | PRN

## 2021-09-01 MED ORDER — OXYTOCIN BOLUS FROM INFUSION
333.0000 mL | Freq: Once | INTRAVENOUS | Status: AC
  Administered 2021-09-02: 333 mL via INTRAVENOUS

## 2021-09-01 MED ORDER — LACTATED RINGERS IV SOLN: INTRAVENOUS | Status: DC

## 2021-09-01 MED ORDER — LIDOCAINE HCL (PF) 1 % IJ SOLN
INTRAMUSCULAR | Status: DC | PRN
  Administered 2021-09-01: 8 mL via EPIDURAL

## 2021-09-01 MED ORDER — OXYCODONE-ACETAMINOPHEN 5-325 MG PO TABS: 1.0000 | ORAL_TABLET | ORAL | Status: DC | PRN

## 2021-09-01 MED ORDER — FENTANYL CITRATE (PF) 100 MCG/2ML IJ SOLN
50.0000 ug | INTRAMUSCULAR | Status: DC | PRN
  Administered 2021-09-01: 100 ug via INTRAVENOUS
  Administered 2021-09-01: 50 ug via INTRAVENOUS
  Filled 2021-09-01 (×2): qty 2

## 2021-09-01 MED ORDER — SOD CITRATE-CITRIC ACID 500-334 MG/5ML PO SOLN
30.0000 mL | ORAL | Status: DC | PRN
  Administered 2021-09-02: 30 mL via ORAL
  Filled 2021-09-01: qty 30

## 2021-09-01 MED ORDER — LACTATED RINGERS IV SOLN
500.0000 mL | INTRAVENOUS | Status: DC | PRN
  Administered 2021-09-02: 500 mL via INTRAVENOUS

## 2021-09-01 MED ORDER — PHENYLEPHRINE 40 MCG/ML (10ML) SYRINGE FOR IV PUSH (FOR BLOOD PRESSURE SUPPORT)
80.0000 ug | PREFILLED_SYRINGE | INTRAVENOUS | Status: DC | PRN
  Administered 2021-09-02 (×2): 80 ug via INTRAVENOUS

## 2021-09-01 MED ORDER — OXYTOCIN-SODIUM CHLORIDE 30-0.9 UT/500ML-% IV SOLN: 1.0000 m[IU]/min | INTRAVENOUS | Status: DC

## 2021-09-01 MED ORDER — OXYTOCIN-SODIUM CHLORIDE 30-0.9 UT/500ML-% IV SOLN: 2.5000 [IU]/h | INTRAVENOUS | Status: DC

## 2021-09-01 NOTE — Anesthesia Preprocedure Evaluation (Signed)
Anesthesia Evaluation  ?Patient identified by MRN, date of birth, ID band ?Patient awake ? ? ? ?Reviewed: ?Allergy & Precautions, H&P , NPO status , Patient's Chart, lab work & pertinent test results, reviewed documented beta blocker date and time  ? ?Airway ?Mallampati: I ? ?TM Distance: >3 FB ?Neck ROM: full ? ? ? Dental ?no notable dental hx. ?(+) Teeth Intact, Dental Advisory Given ?  ?Pulmonary ?neg pulmonary ROS,  ?  ?Pulmonary exam normal ?breath sounds clear to auscultation ? ? ? ? ? ? Cardiovascular ?negative cardio ROS ?Normal cardiovascular exam ?Rhythm:regular Rate:Normal ? ? ?  ?Neuro/Psych ?negative neurological ROS ? negative psych ROS  ? GI/Hepatic ?negative GI ROS, Neg liver ROS,   ?Endo/Other  ?Morbid obesity ? Renal/GU ?negative Renal ROS  ?negative genitourinary ?  ?Musculoskeletal ? ? Abdominal ?  ?Peds ? Hematology ? ?(+) Blood dyscrasia, anemia ,   ?Anesthesia Other Findings ? ? Reproductive/Obstetrics ?(+) Pregnancy ? ?  ? ? ? ? ? ? ? ? ? ? ? ? ? ?  ?  ? ? ? ? ? ? ? ? ?Anesthesia Physical ?Anesthesia Plan ? ?ASA: 2 ? ?Anesthesia Plan: Epidural  ? ?Post-op Pain Management: Minimal or no pain anticipated  ? ?Induction:  ? ?PONV Risk Score and Plan: 2 and Treatment may vary due to age or medical condition ? ?Airway Management Planned: Natural Airway ? ?Additional Equipment: None ? ?Intra-op Plan:  ? ?Post-operative Plan:  ? ?Informed Consent: I have reviewed the patients History and Physical, chart, labs and discussed the procedure including the risks, benefits and alternatives for the proposed anesthesia with the patient or authorized representative who has indicated his/her understanding and acceptance.  ? ? ? ? ? ?Plan Discussed with: Anesthesiologist and CRNA ? ?Anesthesia Plan Comments:   ? ? ? ? ? ? ?Anesthesia Quick Evaluation ? ?

## 2021-09-01 NOTE — H&P (Signed)
OBSTETRIC ADMISSION HISTORY AND PHYSICAL ? ?Julie Atkins is a 26 y.o. female [phone] with IUP at [redacted]w[redacted]d by LMP presenting for Breech/ Version and found to be vertex, desires elective IOL. She reports +FMs, No LOF, no VB, no blurry vision, headaches or peripheral edema, and RUQ pain.  She plans on breast feeding. She request ppBTS for birth control. ?She received her prenatal [facility] at [facility]  ? ?Dating: By LMP/12 --->  Estimated Date of Delivery: 09/07/21 ? ?Sono:   ?@[redacted]w[redacted]d , CWD, normal anatomy, cephalic presentation, [id number], 18% EFW ? ? ?Prenatal History/Complications: None ? ?Past Medical History: ?Past Medical History:  ?Diagnosis Date  ? Abdominal pain   ? Hx of postpartum hemorrhage, currently pregnant   ? ? ?Past Surgical History: ?Past Surgical History:  ?Procedure Laterality Date  ? TONSILLECTOMY    ? TYMPANOTOMY  2004  ? ? ?Obstetrical History: ?OB History   ? ? Gravida  ?3  ? Para  ?2  ? Term  ?2  ? Preterm  ?   ? AB  ?   ? Living  ?2  ?  ? ? SAB  ?   ? IAB  ?   ? Ectopic  ?   ? Multiple  ?0  ? Live Births  ?2  ?   ?  ?  ? ? ?Social History ?Social History  ? ?Socioeconomic History  ? Marital status: Single  ?  Spouse name: Not on file  ? Number of children: 2  ? Years of education: Not on file  ? Highest education level: Not on file  ?Occupational History  ? Not on file  ?Tobacco Use  ? Smoking status: Never  ? Smokeless tobacco: Never  ?Vaping Use  ? Vaping Use: Never used  ?Substance and Sexual Activity  ? Alcohol use: Not Currently  ? Drug use: Not Currently  ? Sexual activity: Yes  ?  Birth control/protection: None  ?Other Topics Concern  ? Not on file  ?Social History Narrative  ? 9th grade  ? ?Social Determinants of Health  ? ?Financial Resource Strain: Not on file  ?Food Insecurity: Not on file  ?Transportation Needs: Not on file  ?Physical Activity: Not on file  ?Stress: Not on file  ?Social Connections: Not on file  ? ? ?Family History: ?Family History  ?Problem Relation Age of Onset  ?  Hypertension Father   ? ? ?Allergies: ?No Known Allergies ? ?Medications Prior to Admission  ?Medication Sig Dispense Refill Last Dose  ? ferrous sulfate 325 (65 FE) MG tablet Take 1 tablet (325 mg total) by mouth every other day. 45 tablet 2 08/31/2021  ? Prenatal Vit-Fe Fumarate-FA (PRENATAL VITAMIN PO) Take by mouth.   08/31/2021  ? acetaminophen (TYLENOL) 500 MG tablet Take 500 mg by mouth every 6 (six) hours as needed.     ? ? ? ?Review of Systems  ? ?All systems reviewed and negative except as stated in HPI ? ?Blood pressure 118/66, pulse 89, temperature 98.2 ?F (36.8 ?C), temperature source Oral, resp. rate 18, weight 97.5 kg, last menstrual period 12/01/2020, SpO2 100 %. ?General appearance: alert, cooperative, and appears stated age ?Lungs: clear to auscultation bilaterally ?Heart: regular rate and rhythm ?Abdomen: soft, non-tender; bowel sounds normal ?Pelvic: adequate ?Extremities: Homans sign is negative, no sign of DVT ?DTR's WNL ?Presentation: cephalic!!!! ?Fetal monitoringBaseline: 145 bpm, Variability: Good {> 6 bpm), Accelerations: Reactive, and Decelerations: Absent ?Uterine activity: None ?  ? ? ?Prenatal labs: ?ABO, Rh: --/--/AB POS (03/29  0800) ?Antibody: NEG (03/29 0800) ?Rubella: 1.11 (09/28 1640) ?RPR: Non Reactive (01/17 0818)  ?HBsAg: Negative (09/28 1640)  ?HIV: Non Reactive (01/17 0818)  ?GBS: Negative/-- (03/13 1635)  ?2 hr Glucola : WNL ?Genetic screening  WNL ?Anatomy US WNL ? ?Prenatal Transfer Tool  ?Maternal Diabetes: No ?Genetic Screening: Normal ?Maternal Ultrasounds/Referrals: Normal ?Fetal Ultrasounds or other Referrals:  None ?Maternal Substance Abuse:  No ?Significant Maternal Medications:  None ?Significant Maternal Lab Results: Group B Strep negative ? ?Results for orders placed or performed during the hospital encounter of 09/01/21 (from the past 24 hour(s))  ?Type and screen  ? Collection Time: 09/01/21  8:00 AM  ?Result Value Ref Range  ? ABO/RH(D) AB POS   ? Antibody  Screen NEG   ? Sample Expiration    ?  09/04/2021,2359 ?Performed at [facility], [place] [address], [place], [place] [address] ?  ?CBC  ? Collection Time: 09/01/21  8:28 AM  ?Result Value Ref Range  ? WBC 8.6 4.0 - 10.5 K/uL  ? RBC 3.79 (L) 3.87 - 5.11 MIL/uL  ? Hemoglobin 9.5 (L) 12.0 - 15.0 g/dL  ? HCT 30.6 (L) 36.0 - 46.0 %  ? MCV 80.7 80.0 - 100.0 fL  ? MCH 25.1 (L) 26.0 - 34.0 pg  ? MCHC 31.0 30.0 - 36.0 g/dL  ? RDW 14.8 11.5 - 15.5 %  ? Platelets 323 150 - 400 K/uL  ? nRBC 0.0 0.0 - 0.2 %  ? ? ?Patient Active Problem List  ? Diagnosis Date Noted  ? Breech presentation 09/01/2021  ? Request for sterilization 07/21/2021  ? 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 Encounter for supervision of normal pregnancy, antepartum 03/22/2021  ? Abdominal pain, RLQ (right lower quadrant) 12/09/2020  ? History of postpartum hemorrhage 09/19/2018  ? ? ?Assessment/Plan:  ?Julie Atkins is a 26 y.o. G3P2002 at [redacted]w[redacted]d here for eIOL ? ?#Labor: Plan to place epidural and early AROM ?#Pain: Epidural when ready, prn medication ?#FWB: Cat 1 ?#ID:  GBS neg ?#MOF: breast ?#MOC: BTS ?#Circ:  NA ? ?# h/o PPH: monitor closely after delivery ? ?#Anemia: recommend postpartum venofer  ? ?Caren Macadam, MD  ?09/01/2021, 9:24 AM ? ? ? ?

## 2021-09-01 NOTE — Anesthesia Procedure Notes (Signed)
Epidural ?Patient location during procedure: OB ?Start time: 09/01/2021 10:53 PM ?End time: 09/01/2021 10:57 PM ? ?Staffing ?Anesthesiologist: Janeece Riggers, MD ? ?Preanesthetic Checklist ?Completed: patient identified, IV checked, site marked, risks and benefits discussed, surgical consent, monitors and equipment checked, pre-op evaluation and timeout performed ? ?Epidural ?Patient position: sitting ?Prep: DuraPrep and site prepped and draped ?Patient monitoring: continuous pulse ox and blood pressure ?Approach: midline ?Location: L3-L4 ?Injection technique: LOR air ? ?Needle:  ?Needle type: Tuohy  ?Needle gauge: 17 G ?Needle length: 9 cm and 9 ?Needle insertion depth: 6 cm ?Catheter type: closed end flexible ?Catheter size: 19 Gauge ?Catheter at skin depth: 12 cm ?Test dose: negative ? ?Assessment ?Events: blood not aspirated, injection not painful, no injection resistance, no paresthesia and negative IV test ? ? ? ?

## 2021-09-01 NOTE — Progress Notes (Signed)
Patient ID: Julie Atkins, female   DOB: 1995/11/24, 24 y.o.   MRN: [id number] ? ?S/p cytotec x 2 and feeling more uncomfortable; just received Fentanyl ? ?BP 123/63, P 93 ?FHR 150s, +accels, no decels, Cat 1 ?Ctx q 1-3 mins ?Cx post/1+cm/thick/vtx -3 ? ?IUP@39 .1wks ?IOL process ?Cx unfavorable ? ?Cervical foley inserted without difficulty and inflated with 60cc fluid ?Plan for Pit/AROM when foley is dislodged ?Anticipate vag del ? ?Julie Atkins CNM ?09/01/2021 ?9:08 PM ? ? ?

## 2021-09-01 NOTE — Progress Notes (Signed)
Labor Progress Note ?Julie Atkins is a 26 y.o. G3P2002 at [redacted]w[redacted]d who presented for eIOL.  ? ?S: Doing well. No concerns.  ? ?O:  ?BP (!) 113/48   Pulse 98   Temp 98.1 ?F (36.7 ?C) (Oral)   Resp 16   Ht 5\' 1"  (1.549 m)   Wt 97.5 kg   LMP 12/01/2020   SpO2 100%   BMI 40.62 kg/m?  ? ?EFM: Baseline 140 bpm, moderate variability, + accels, no decels  ?Toco: Every 1-2 minutes  ? ?CVE: Dilation: Fingertip ?Effacement (%): Thick ?Cervical Position: Posterior ?Station: -3 ?Presentation: Vertex ?Exam by:: k fields, rn ? ?A&P: 26 y.o. 25 [redacted]w[redacted]d  ? ?#Labor: Progressing. 2nd dose of Cytotec given at 1635. Will reassess in 4 hours. Will plan foley balloon when able.  ?#Pain: PRN ?#FWB: Cat 1  ?#GBS negative ? ?[redacted]w[redacted]d, MD ?7:43 PM ? ?

## 2021-09-02 ENCOUNTER — Encounter (HOSPITAL_COMMUNITY)
Admission: AD | Disposition: A | Discharge status: Home / Self Care | Payer: Self-pay | Source: Home / Self Care | Attending: Obstetrics & Gynecology

## 2021-09-02 ENCOUNTER — Inpatient Hospital Stay (HOSPITAL_COMMUNITY): Payer: Medicaid Other | Admitting: Anesthesiology

## 2021-09-02 ENCOUNTER — Encounter: Payer: Self-pay | Admitting: Certified Nurse Midwife

## 2021-09-02 DIAGNOSIS — Z3A39 39 weeks gestation of pregnancy: Secondary | ICD-10-CM

## 2021-09-02 DIAGNOSIS — Z302 Encounter for sterilization: Secondary | ICD-10-CM

## 2021-09-02 HISTORY — PX: TUBAL LIGATION: SHX77

## 2021-09-02 SURGERY — LIGATION, FALLOPIAN TUBE, POSTPARTUM
Anesthesia: Epidural | Laterality: Bilateral

## 2021-09-02 MED ORDER — COCONUT OIL OIL
1.0000 "application " | TOPICAL_OIL | Status: DC | PRN
  Filled 2021-09-02: qty 120

## 2021-09-02 MED ORDER — DEXAMETHASONE SODIUM PHOSPHATE 4 MG/ML IJ SOLN
INTRAMUSCULAR | Status: AC
  Filled 2021-09-02: qty 2

## 2021-09-02 MED ORDER — AMISULPRIDE (ANTIEMETIC) 5 MG/2ML IV SOLN: 10.0000 mg | Freq: Once | INTRAVENOUS | Status: DC | PRN

## 2021-09-02 MED ORDER — MEDROXYPROGESTERONE ACETATE 150 MG/ML IM SUSP: 150.0000 mg | INTRAMUSCULAR | Status: DC | PRN

## 2021-09-02 MED ORDER — SODIUM BICARBONATE 8.4 % IV SOLN
INTRAVENOUS | Status: AC
  Filled 2021-09-02: qty 50

## 2021-09-02 MED ORDER — TRANEXAMIC ACID-NACL 1000-0.7 MG/100ML-% IV SOLN
INTRAVENOUS | Status: AC
  Administered 2021-09-02: 1000 mg
  Filled 2021-09-02: qty 100

## 2021-09-02 MED ORDER — ONDANSETRON HCL 4 MG/2ML IJ SOLN
INTRAMUSCULAR | Status: AC
  Filled 2021-09-02: qty 2

## 2021-09-02 MED ORDER — ONDANSETRON HCL 4 MG/2ML IJ SOLN: 4.0000 mg | INTRAMUSCULAR | Status: DC | PRN

## 2021-09-02 MED ORDER — METHYLERGONOVINE MALEATE 0.2 MG PO TABS: 0.2000 mg | ORAL_TABLET | ORAL | Status: DC | PRN

## 2021-09-02 MED ORDER — HYDROMORPHONE HCL 1 MG/ML IJ SOLN: 0.2500 mg | INTRAMUSCULAR | Status: DC | PRN

## 2021-09-02 MED ORDER — FENTANYL CITRATE (PF) 100 MCG/2ML IJ SOLN
INTRAMUSCULAR | Status: AC
Start: 2021-09-02 — End: ?
  Filled 2021-09-02: qty 2

## 2021-09-02 MED ORDER — WITCH HAZEL-GLYCERIN EX PADS: 1.0000 "application " | MEDICATED_PAD | CUTANEOUS | Status: DC | PRN

## 2021-09-02 MED ORDER — OXYCODONE HCL 5 MG/5ML PO SOLN: 5.0000 mg | Freq: Once | ORAL | Status: DC | PRN

## 2021-09-02 MED ORDER — MEASLES, MUMPS & RUBELLA VAC IJ SOLR
0.5000 mL | Freq: Once | INTRAMUSCULAR | Status: DC
  Filled 2021-09-02: qty 0.5

## 2021-09-02 MED ORDER — TETANUS-DIPHTH-ACELL PERTUSSIS 5-2.5-18.5 LF-MCG/0.5 IM SUSY
0.5000 mL | PREFILLED_SYRINGE | Freq: Once | INTRAMUSCULAR | Status: DC
  Filled 2021-09-02: qty 0.5

## 2021-09-02 MED ORDER — MEPERIDINE HCL 25 MG/ML IJ SOLN: 6.2500 mg | INTRAMUSCULAR | Status: DC | PRN

## 2021-09-02 MED ORDER — PRENATAL MULTIVITAMIN CH
1.0000 | ORAL_TABLET | Freq: Every day | ORAL | Status: DC
  Administered 2021-09-03: 1 via ORAL
  Filled 2021-09-02 (×2): qty 1

## 2021-09-02 MED ORDER — DEXMEDETOMIDINE (PRECEDEX) IN NS 20 MCG/5ML (4 MCG/ML) IV SYRINGE
PREFILLED_SYRINGE | INTRAVENOUS | Status: DC | PRN
  Administered 2021-09-02: 12 ug via INTRAVENOUS
  Administered 2021-09-02: 8 ug via INTRAVENOUS

## 2021-09-02 MED ORDER — TERBUTALINE SULFATE 1 MG/ML IJ SOLN: 0.2500 mg | Freq: Once | INTRAMUSCULAR | Status: DC | PRN

## 2021-09-02 MED ORDER — PHENYLEPHRINE HCL (PRESSORS) 10 MG/ML IV SOLN
INTRAVENOUS | Status: DC | PRN
  Administered 2021-09-02: 40 ug via INTRAVENOUS

## 2021-09-02 MED ORDER — METOCLOPRAMIDE HCL 5 MG/ML IJ SOLN
INTRAMUSCULAR | Status: AC
  Filled 2021-09-02: qty 2

## 2021-09-02 MED ORDER — DOCUSATE SODIUM 100 MG PO CAPS
100.0000 mg | ORAL_CAPSULE | Freq: Two times a day (BID) | ORAL | Status: DC
  Administered 2021-09-03: 100 mg via ORAL
  Filled 2021-09-02 (×2): qty 1

## 2021-09-02 MED ORDER — STERILE WATER FOR IRRIGATION IR SOLN
Status: DC | PRN
Start: 2021-09-02 — End: 2021-09-02

## 2021-09-02 MED ORDER — ONDANSETRON HCL 4 MG PO TABS: 4.0000 mg | ORAL_TABLET | ORAL | Status: DC | PRN

## 2021-09-02 MED ORDER — LIDOCAINE-EPINEPHRINE 2 %-1:200000 IJ SOLN
INTRAMUSCULAR | Status: AC
Start: 2021-09-02 — End: ?
  Filled 2021-09-02: qty 20

## 2021-09-02 MED ORDER — METHYLERGONOVINE MALEATE 0.2 MG/ML IJ SOLN
0.2000 mg | Freq: Once | INTRAMUSCULAR | Status: AC
  Administered 2021-09-02: 0.2 mg via INTRAMUSCULAR

## 2021-09-02 MED ORDER — IBUPROFEN 600 MG PO TABS
600.0000 mg | ORAL_TABLET | Freq: Four times a day (QID) | ORAL | Status: DC
  Administered 2021-09-02 – 2021-09-03 (×4): 600 mg via ORAL
  Filled 2021-09-02 (×4): qty 1

## 2021-09-02 MED ORDER — LIDOCAINE-EPINEPHRINE (PF) 2 %-1:200000 IJ SOLN
INTRAMUSCULAR | Status: DC | PRN
  Administered 2021-09-02: 5 mL via EPIDURAL
  Administered 2021-09-02: 10 mL via EPIDURAL

## 2021-09-02 MED ORDER — LACTATED RINGERS IV SOLN: INTRAVENOUS | Status: DC | PRN

## 2021-09-02 MED ORDER — BENZOCAINE-MENTHOL 20-0.5 % EX AERO
1.0000 "application " | INHALATION_SPRAY | CUTANEOUS | Status: DC | PRN
  Filled 2021-09-02: qty 56

## 2021-09-02 MED ORDER — SENNOSIDES-DOCUSATE SODIUM 8.6-50 MG PO TABS
2.0000 | ORAL_TABLET | Freq: Every day | ORAL | Status: DC
  Administered 2021-09-03: 2 via ORAL
  Filled 2021-09-02 (×2): qty 2

## 2021-09-02 MED ORDER — ONDANSETRON HCL 4 MG/2ML IJ SOLN: 4.0000 mg | Freq: Once | INTRAMUSCULAR | Status: DC | PRN

## 2021-09-02 MED ORDER — ACETAMINOPHEN 325 MG PO TABS
650.0000 mg | ORAL_TABLET | ORAL | Status: DC | PRN
  Administered 2021-09-02: 650 mg via ORAL
  Filled 2021-09-02: qty 2

## 2021-09-02 MED ORDER — SIMETHICONE 80 MG PO CHEW
80.0000 mg | CHEWABLE_TABLET | ORAL | Status: DC | PRN
  Filled 2021-09-02: qty 1

## 2021-09-02 MED ORDER — DIBUCAINE (PERIANAL) 1 % EX OINT
1.0000 | TOPICAL_OINTMENT | CUTANEOUS | Status: DC | PRN
Start: 2021-09-02 — End: 2021-09-03
  Filled 2021-09-02: qty 28

## 2021-09-02 MED ORDER — OXYCODONE HCL 5 MG PO TABS
10.0000 mg | ORAL_TABLET | ORAL | Status: DC | PRN
  Administered 2021-09-02 – 2021-09-03 (×4): 10 mg via ORAL
  Filled 2021-09-02 (×4): qty 2

## 2021-09-02 MED ORDER — OXYTOCIN-SODIUM CHLORIDE 30-0.9 UT/500ML-% IV SOLN
1.0000 m[IU]/min | INTRAVENOUS | Status: DC
  Administered 2021-09-02: 2 m[IU]/min via INTRAVENOUS
  Filled 2021-09-02: qty 500

## 2021-09-02 MED ORDER — DIPHENHYDRAMINE HCL 25 MG PO CAPS: 25.0000 mg | ORAL_CAPSULE | Freq: Four times a day (QID) | ORAL | Status: DC | PRN

## 2021-09-02 MED ORDER — TRANEXAMIC ACID-NACL 1000-0.7 MG/100ML-% IV SOLN: 1000.0000 mg | INTRAVENOUS | Status: DC

## 2021-09-02 MED ORDER — FERROUS SULFATE 325 (65 FE) MG PO TABS
325.0000 mg | ORAL_TABLET | ORAL | Status: DC
  Filled 2021-09-02: qty 1

## 2021-09-02 MED ORDER — FENTANYL CITRATE (PF) 100 MCG/2ML IJ SOLN
INTRAMUSCULAR | Status: DC | PRN
  Administered 2021-09-02 (×2): 50 ug via INTRAVENOUS

## 2021-09-02 MED ORDER — FENTANYL CITRATE (PF) 100 MCG/2ML IJ SOLN
INTRAMUSCULAR | Status: DC | PRN
  Administered 2021-09-02: 100 ug via EPIDURAL

## 2021-09-02 MED ORDER — BUPIVACAINE HCL (PF) 0.25 % IJ SOLN
INTRAMUSCULAR | Status: AC
Start: 2021-09-02 — End: ?
  Filled 2021-09-02: qty 30

## 2021-09-02 MED ORDER — MIDAZOLAM HCL 2 MG/2ML IJ SOLN
INTRAMUSCULAR | Status: AC
  Filled 2021-09-02: qty 2

## 2021-09-02 MED ORDER — MIDAZOLAM HCL 5 MG/5ML IJ SOLN
INTRAMUSCULAR | Status: DC | PRN
  Administered 2021-09-02: 2 mg via INTRAVENOUS

## 2021-09-02 MED ORDER — METHYLERGONOVINE MALEATE 0.2 MG/ML IJ SOLN
INTRAMUSCULAR | Status: AC
  Filled 2021-09-02: qty 1

## 2021-09-02 MED ORDER — ONDANSETRON HCL 4 MG/2ML IJ SOLN
INTRAMUSCULAR | Status: DC | PRN
  Administered 2021-09-02: 4 mg via INTRAVENOUS

## 2021-09-02 MED ORDER — KETOROLAC TROMETHAMINE 30 MG/ML IJ SOLN: 30.0000 mg | Freq: Once | INTRAMUSCULAR | Status: DC | PRN

## 2021-09-02 MED ORDER — DEXAMETHASONE SODIUM PHOSPHATE 4 MG/ML IJ SOLN
INTRAMUSCULAR | Status: DC | PRN
  Administered 2021-09-02: 8 mg via INTRAVENOUS

## 2021-09-02 MED ORDER — OXYCODONE HCL 5 MG PO TABS: 5.0000 mg | ORAL_TABLET | Freq: Once | ORAL | Status: DC | PRN

## 2021-09-02 MED ORDER — METHYLERGONOVINE MALEATE 0.2 MG/ML IJ SOLN: 0.2000 mg | INTRAMUSCULAR | Status: DC | PRN

## 2021-09-02 SURGICAL SUPPLY — 21 items
BLADE SURG 11 STRL SS (BLADE) ×2 IMPLANT
CLIP FILSHIE TUBAL LIGA STRL (Clip) ×2 IMPLANT
CLOTH BEACON ORANGE TIMEOUT ST (SAFETY) ×2 IMPLANT
DRSG OPSITE POSTOP 3X4 (GAUZE/BANDAGES/DRESSINGS) ×2 IMPLANT
DURAPREP 26ML APPLICATOR (WOUND CARE) ×2 IMPLANT
GLOVE BIOGEL PI IND STRL 7.0 (GLOVE) ×3 IMPLANT
GLOVE BIOGEL PI INDICATOR 7.0 (GLOVE) ×3
GLOVE ECLIPSE 7.0 STRL STRAW (GLOVE) ×2 IMPLANT
GOWN STRL REUS W/TWL LRG LVL3 (GOWN DISPOSABLE) ×4 IMPLANT
MAT PREVALON FULL STRYKER (MISCELLANEOUS) ×1 IMPLANT
NEEDLE HYPO 22GX1.5 SAFETY (NEEDLE) ×2 IMPLANT
NS IRRIG 1000ML POUR BTL (IV SOLUTION) ×2 IMPLANT
PACK ABDOMINAL MINOR (CUSTOM PROCEDURE TRAY) ×2 IMPLANT
PROTECTOR NERVE ULNAR (MISCELLANEOUS) ×2 IMPLANT
SPONGE LAP 4X18 RFD (DISPOSABLE) IMPLANT
SUT VICRYL 0 UR6 27IN ABS (SUTURE) ×2 IMPLANT
SUT VICRYL 4-0 PS2 18IN ABS (SUTURE) ×2 IMPLANT
SYR CONTROL 10ML LL (SYRINGE) ×2 IMPLANT
TOWEL OR 17X24 6PK STRL BLUE (TOWEL DISPOSABLE) ×4 IMPLANT
TRAY FOLEY CATH SILVER 14FR (SET/KITS/TRAYS/PACK) ×2 IMPLANT
WATER STERILE IRR 1000ML POUR (IV SOLUTION) ×2 IMPLANT

## 2021-09-02 NOTE — Discharge Summary (Signed)
? ?  Postpartum Discharge Summary ? ?   ?Patient Name: Julie Atkins ?DOB: 09/29/1995 ?MRN: [id number] ? ?Date of admission: 09/01/2021 ?Delivery date:09/02/2021  ?Delivering provider: AGBAZA, ONOME S  ?Date of discharge: 09/03/2021 ? ?Admitting diagnosis: Breech presentation [O32.1XX0] ?Encounter for elective induction of labor [Z34.90] ?Intrauterine pregnancy: [redacted]w[redacted]d    ?Secondary diagnosis:  Active Problems: ?  History of postpartum hemorrhage ?  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  Request for sterilization ? ?Additional problems: none    ?Discharge diagnosis: Term Pregnancy Delivered                                              ?Post partum procedures:postpartum tubal ligation ?Augmentation: AROM, Pitocin, Cytotec, and IP Foley ?Complications: None ? ?Hospital course: Induction of Labor With Vaginal Delivery   ?26y.o. yo G3P2002 at 315w2das admitted to the hospital 09/01/2021 for induction of labor.  Indication for induction:  elective at term, also with unstable lie as baby was breech on 08/30/21 .  Patient had an uncomplicated labor course, including the administration of TXA with delivery due to hx of PPH. ?Membrane Rupture Time/Date: 7:27 AM ,09/02/2021   ?Delivery Method:Vaginal, Spontaneous  ?Episiotomy: None  ?Lacerations:  None  ?Details of delivery can be found in separate delivery note.  Patient had a postpartum course remarkable for undergoing a ppBTL shortly after delivery which she tolerated well. Patient is discharged home 09/03/21. ? ?Newborn Data: ?Birth date:09/02/2021  ?Birth time:8:22 AM  ?Gender:Female  ?Living status:Living  ?Apgars:8 ,8  ?Weight:3660 g  ? ?Magnesium Sulfate received: No ?BMZ received: No ?Rhophylac:N/A ?MMR:N/A ?T-DaP:Given prenatally ?Flu: Yes ?Transfusion:No ? ?Physical exam  ?Vitals:  ? 09/02/21 2211 09/03/21 0157 09/03/21 0531 09/03/21 0900  ?BP: (!) 107/53 (!) 89/55 93/60 (!) 88/61  ?Pulse: 61 (!) 54 66 71  ?Resp: '18 16 18 17  ' ?Temp: 98.1 ?F (36.7 ?C) 98 ?F (36.7 ?C) 97.8  ?F (36.6 ?C) 97.7 ?F (36.5 ?C)  ?TempSrc: Oral Oral Oral Oral  ?SpO2: 98% 99% 100%   ?Weight:      ?Height:      ? ?General: alert ?Lochia: appropriate ?Uterine Fundus: firm ?Incision: Dressing is clean, dry, and intact ?DVT Evaluation: No evidence of DVT seen on physical exam. ?Labs: ?Lab Results  ?Component Value Date  ? WBC 8.6 09/01/2021  ? HGB 9.5 (L) 09/01/2021  ? HCT 30.6 (L) 09/01/2021  ? MCV 80.7 09/01/2021  ? PLT 323 09/01/2021  ? ? ?  Latest Ref Rng & Units 12/09/2020  ?  6:40 PM  ?CMP  ?Glucose 70 - 99 mg/dL 95    ?BUN 6 - 20 mg/dL 8    ?Creatinine 0.44 - 1.00 mg/dL 0.76    ?Sodium 135 - 145 mmol/L 137    ?Potassium 3.5 - 5.1 mmol/L 4.3    ?Chloride 98 - 111 mmol/L 104    ?CO2 22 - 32 mmol/L 27    ?Calcium 8.9 - 10.3 mg/dL 8.9    ?Total Protein 6.5 - 8.1 g/dL 7.8    ?Total Bilirubin 0.3 - 1.2 mg/dL 0.9    ?Alkaline Phos 38 - 126 U/L 80    ?AST 15 - 41 U/L 15    ?ALT 0 - 44 U/L 13    ? ?Edinburgh Score: ? ?  09/03/2021  ?  8:05 AM  ?EdFlavia Shipperostnatal Depression Scale Screening  Tool  ?I have been able to laugh and see the funny side of things. 0  ?I have looked forward with enjoyment to things. 0  ?I have blamed myself unnecessarily when things went wrong. 0  ?I have been anxious or worried for no good reason. 3  ?I have felt scared or panicky for no good reason. 0  ?Things have been getting on top of me. 0  ?I have been so unhappy that I have had difficulty sleeping. 0  ?I have felt sad or miserable. 0  ?I have been so unhappy that I have been crying. 0  ?The thought of harming myself has occurred to me. 0  ?Edinburgh Postnatal Depression Scale Total 3  ? ? ? ?After visit meds:  ?Allergies as of 09/03/2021   ?No Known Allergies ?  ? ?  ?Medication List  ?  ? ?TAKE these medications   ? ?acetaminophen 325 MG tablet ?Commonly known as: Tylenol ?Take 2 tablets (650 mg total) by mouth every 4 (four) hours as needed (for pain scale < 4). ?What changed:  ?medication strength ?how much to take ?when to take  this ?reasons to take this ?  ?docusate sodium 100 MG capsule ?Commonly known as: COLACE ?Take 1 capsule (100 mg total) by mouth daily as needed (constipation). ?  ?ferrous sulfate 325 (65 FE) MG tablet ?Take 1 tablet (325 mg total) by mouth every other day. ?  ?ibuprofen 600 MG tablet ?Commonly known as: ADVIL ?Take 1 tablet (600 mg total) by mouth every 6 (six) hours. ?  ?Oxycodone HCl 10 MG Tabs ?Take 0.5 tablets (5 mg total) by mouth every 6 (six) hours as needed for severe pain. ?  ?PRENATAL VITAMIN PO ?Take by mouth. ?  ? ?  ? ? ? ?Discharge home in stable condition ?Infant Feeding: Breast ?Infant Disposition:home with mother ?Discharge instruction: per After Visit Summary and Postpartum booklet. ?Activity: Advance as tolerated. Pelvic rest for 6 weeks.  ?Diet: routine diet ?Future Appointments: ?No future appointments. ? ?Follow up Visit: ? ?Myrtis Ser, CNM  [facility] ?Please schedule this patient for Postpartum visit in: 6 weeks with the following provider: Any provider  ?In-Person  ?For C/S patients schedule nurse incision check in weeks 2 weeks: no  ?Low risk pregnancy complicated by: none  ?Delivery mode:  SVD  ?Anticipated Birth Control:  BTL done PP  ?PP Procedures needed: none  ?Schedule Integrated BH visit: no  ? ?Renard Matter, MD, MPH ?OB Fellow, Faculty Practice ? ? ?

## 2021-09-02 NOTE — Anesthesia Postprocedure Evaluation (Signed)
Anesthesia Post Note ? ?Patient: Julie Atkins ? ?Procedure(s) Performed: AN AD HOC LABOR EPIDURAL ? ?  ? ?Patient location during evaluation: PACU ?Anesthesia Type: Epidural ?Level of consciousness: awake and alert ?Pain management: pain level controlled ?Vital Signs Assessment: post-procedure vital signs reviewed and stable ?Respiratory status: spontaneous breathing, nonlabored ventilation and respiratory function stable ?Cardiovascular status: stable ?Postop Assessment: no headache, no backache and epidural receding ?Anesthetic complications: no ? ? ?No notable events documented. ? ?Last Vitals:  ?Vitals:  ? 09/02/21 1145 09/02/21 1200  ?BP: 114/63 114/64  ?Pulse: 68 62  ?Resp: 14 17  ?Temp:    ?SpO2: 95% 96%  ?  ?Last Pain:  ?Vitals:  ? 09/02/21 1130  ?TempSrc: Oral  ?PainSc: 0-No pain  ? ?Pain Goal:   ? ?LLE Motor Response: Purposeful movement (09/02/21 1145) ?LLE Sensation: Tingling (09/02/21 1145) ?RLE Motor Response: Purposeful movement (09/02/21 1145) ?RLE Sensation: Full sensation (09/02/21 1145) ?L Sensory Level: L4-Anterior knee, lower leg (09/02/21 1145) ?R Sensory Level: L4-Anterior knee, lower leg (09/02/21 1145) ?Epidural/Spinal Function Cutaneous sensation: Normal sensation (09/02/21 1145), Patient able to flex knees: Yes (09/02/21 1145), Patient able to lift hips off bed: Yes (09/02/21 1145), Back pain beyond tenderness at insertion site: No (09/02/21 1145), Progressively worsening motor and/or sensory loss: No (09/02/21 1145), Bowel and/or bladder incontinence post epidural: No (09/02/21 1145) ? ?Julie Atkins ? ? ? ? ?

## 2021-09-02 NOTE — Progress Notes (Signed)
LABOR PROGRESS NOTE ? ?Subjective: Patient is resting comfortably on R side w/ peanut ball. She does not report any abnormal s/s at this time. Denies discomfort or pressure with CTXs. ? ?Objective: ?Blood pressure (!) 113/59, pulse 94, temperature 98.1 ?F (36.7 ?C), temperature source Oral, resp. rate 16, height 5\' 1"  (1.549 m), weight 97.5 kg, last menstrual period 12/01/2020, SpO2 100 %. ?Temp (24hrs), Avg:98 ?F (36.7 ?C), Min:97.2 ?F (36.2 ?C), Max:98.3 ?F (36.8 ?C) ? ? ?Fetal Heart Tones ?Baseline: 135 ?Variability: moderate ?Accelerations: present; 15x15 ?Decelerations: absent ?Overall assessment: cat 1 ? ?Contractions: q2-3 min ? ?Cervical Exam: 7/70%/-2  ? ?ASSESSMENT AND PLAN:   ?Normal IUP in active labor pattern. ?Continue current management ?Reassess and likely AROM if appropriate at next SVE ? ?Anticipate SVD ? ?Electronically Signed By: ?12/03/2020, SNM ?09/02/21 5:59 AM   ?

## 2021-09-02 NOTE — Addendum Note (Signed)
Addendum  created 09/02/21 1259 by Graciela Husbands, CRNA  ? Intraprocedure Event edited, Intraprocedure Staff edited  ?  ?

## 2021-09-02 NOTE — Progress Notes (Signed)
Postpartum tubal consent: ? ?26 y.o. [id number]  with undesired fertility,status post vaginal delivery, expressed desire for permanent sterilization.  I reviewed with the patient her expressed plan for tubal sterilization.  We dicussed other reversible forms of contraception including LARC options with similar effectiveness of BTS. She expressed that she strongly desire permanent sterilization. She declines all other modalities. Risks of procedure discussed with patient including but not limited to: risk of regret, permanence of method, bleeding, infection, injury to surrounding organs and need for additional procedures.  Failure risk of 0.5-1% with increased risk of ectopic gestation if pregnancy occurs was also discussed with patient.   ? ?We discussed that she did have increased bleeding at delivery but my assessment of her fundus was that it was firm and bleeding was minimal after administration of TXA, Pit and methergine.   ? ?Reviewed salpingectomy, pomeroy, and Filshie clip. We will plan on Filshie clip placement as the patient wants a quick procedure and I think this will minimize the risk of bleeding. Patient agreed with plan.  She has been NPO and will remain so for the procedure.  ? ?Federico Flake, MD, MPH, ABFM, IBCLC ?Attending Physician ?[facility] ? ? ?

## 2021-09-02 NOTE — Lactation Note (Signed)
This note was copied from a baby's chart. ?Lactation Consultation Note ? ?Patient Name: Julie Atkins ?Today's Date: 09/02/2021 ?Reason for consult: Initial assessment;Term;Other (Comment);Breastfeeding assistance ?Age:24 hours ?As LC entered the room baby STS on moms chest wide awake and rooting.  ?LC offered to assist to latch and mom receptive. LC reviewed hand expressing with several drops of colostrum noted and areola compressible for a deep latch .  ?Multiple swallows noted, increased with breast compressions and baby fed 13 mins and released. Nipple well rounded and per mom comfortable with feeding.  ?Latch score 8 .  ?Mom aware to call if needing help to latch.  ?Feed with feeding cues ( 8-12 times a day ).  ? ?Maternal Data ?Has patient been taught Hand Expression?: Yes ?Does the patient have breastfeeding experience prior to this delivery?: Yes ?How long did the patient breastfeed?: per mom breast fed her others 1 month each ? ?Feeding ?Mother's Current Feeding Choice: Breast Milk ? ?LATCH Score ?Latch: Repeated attempts needed to sustain latch, nipple held in mouth throughout feeding, stimulation needed to elicit sucking reflex. ? ?Audible Swallowing: Spontaneous and intermittent ? ?Type of Nipple: Everted at rest and after stimulation ? ?Comfort (Breast/Nipple): Soft / non-tender ? ?Hold (Positioning): Assistance needed to correctly position infant at breast and maintain latch. ? ?LATCH Score: 8 ? ? ?Lactation Tools Discussed/Used ?  ? ?Interventions ?Interventions: Breast feeding basics reviewed;Assisted with latch;Skin to skin;Breast massage;Hand express;Reverse pressure;Breast compression;Adjust position;Support pillows;Position options;Education;LC Services brochure ? ?Discharge ?Pump: DEBP;Personal;Hands Free ?[place] Program: No ? ?Consult Status ?Consult Status: Follow-up ?Date: 09/02/21 ?Follow-up type: In-patient ? ? ? ?Julie Atkins ?09/02/2021, 5:23 PM ? ? ? ?

## 2021-09-02 NOTE — Anesthesia Postprocedure Evaluation (Signed)
Anesthesia Post Note ? ?Patient: Julie Atkins ? ?Procedure(s) Performed: POST PARTUM TUBAL LIGATION (Bilateral) ? ?  ? ?Patient location during evaluation: PACU ?Anesthesia Type: Epidural ?Level of consciousness: awake and alert ?Pain management: pain level controlled ?Vital Signs Assessment: post-procedure vital signs reviewed and stable ?Respiratory status: spontaneous breathing, nonlabored ventilation and respiratory function stable ?Cardiovascular status: stable ?Postop Assessment: no headache, no backache and epidural receding ?Anesthetic complications: no ? ? ?No notable events documented. ? ?Last Vitals:  ?Vitals:  ? 09/02/21 1145 09/02/21 1200  ?BP: 114/63 114/64  ?Pulse: 68 62  ?Resp: 14 17  ?Temp:    ?SpO2: 95% 96%  ?  ?Last Pain:  ?Vitals:  ? 09/02/21 1130  ?TempSrc: Oral  ?PainSc: 0-No pain  ? ?Pain Goal:   ? ?LLE Motor Response: Purposeful movement (09/02/21 1145) ?LLE Sensation: Tingling (09/02/21 1145) ?RLE Motor Response: Purposeful movement (09/02/21 1145) ?RLE Sensation: Full sensation (09/02/21 1145) ?L Sensory Level: L4-Anterior knee, lower leg (09/02/21 1145) ?R Sensory Level: L4-Anterior knee, lower leg (09/02/21 1145) ?Epidural/Spinal Function Cutaneous sensation: Normal sensation (09/02/21 1145), Patient able to flex knees: Yes (09/02/21 1145), Patient able to lift hips off bed: Yes (09/02/21 1145), Back pain beyond tenderness at insertion site: No (09/02/21 1145), Progressively worsening motor and/or sensory loss: No (09/02/21 1145), Bowel and/or bladder incontinence post epidural: No (09/02/21 1145) ? ?Tennis Must Saachi Zale ? ? ? ? ?

## 2021-09-02 NOTE — Transfer of Care (Signed)
Immediate Anesthesia Transfer of Care Note ? ?Patient: Julie Atkins ? ?Procedure(s) Performed: POST PARTUM TUBAL LIGATION (Bilateral) ? ?Patient Location: PACU ? ?Anesthesia Type:Epidural ? ?Level of Consciousness: awake, alert  and oriented ? ?Airway & Oxygen Therapy: Patient Spontanous Breathing ? ?Post-op Assessment: Report given to RN and Post -op Vital signs reviewed and stable ? ?Post vital signs: Reviewed and stable ? ?Last Vitals:  ?Vitals Value Taken Time  ?BP    ?Temp    ?Pulse    ?Resp    ?SpO2    ? ? ?Last Pain:  ?Vitals:  ? 09/02/21 0916  ?TempSrc: Oral  ?PainSc:   ?   ? ?  ? ?Complications: No notable events documented. ?

## 2021-09-02 NOTE — Anesthesia Preprocedure Evaluation (Signed)
Anesthesia Evaluation  ?Patient identified by MRN, date of birth, ID band ?Patient awake ? ? ? ?Reviewed: ?Allergy & Precautions, NPO status , Patient's Chart, lab work & pertinent test results ? ?Airway ?Mallampati: III ? ?TM Distance: >3 FB ?Neck ROM: Full ? ? ? Dental ? ?(+) Dental Advisory Given, Teeth Intact ?  ?Pulmonary ?neg pulmonary ROS,  ?  ?Pulmonary exam normal ?breath sounds clear to auscultation ? ? ? ? ? ? Cardiovascular ?negative cardio ROS ?Normal cardiovascular exam ?Rhythm:Regular Rate:Normal ? ? ?  ?Neuro/Psych ?negative neurological ROS ? negative psych ROS  ? GI/Hepatic ?negative GI ROS, Neg liver ROS,   ?Endo/Other  ?Morbid obesityBMI $1 ? Renal/GU ?negative Renal ROS  ?negative genitourinary ?  ?Musculoskeletal ?negative musculoskeletal ROS ?(+)  ? Abdominal ?(+) + obese,   ?Peds ?negative pediatric ROS ?(+)  Hematology ? ?(+) Blood dyscrasia, anemia , Hb 9.5   ?Anesthesia Other Findings ? ? Reproductive/Obstetrics ?Delivered about 1.5h ago, desires sterility  ? ?  ? ? ? ? ? ? ? ? ? ? ? ? ? ?  ?  ? ? ? ? ? ? ? ?Anesthesia Physical ?Anesthesia Plan ? ?ASA: 3 ? ?Anesthesia Plan: Epidural  ? ?Post-op Pain Management: Ofirmev IV (intra-op)* and Toradol IV (intra-op)*  ? ?Induction:  ? ?PONV Risk Score and Plan: 2 and Treatment may vary due to age or medical condition, Ondansetron, Dexamethasone and Midazolam ? ?Airway Management Planned: Natural Airway ? ?Additional Equipment: None ? ?Intra-op Plan:  ? ?Post-operative Plan:  ? ?Informed Consent: I have reviewed the patients History and Physical, chart, labs and discussed the procedure including the risks, benefits and alternatives for the proposed anesthesia with the patient or authorized representative who has indicated his/her understanding and acceptance.  ? ? ? ? ? ?Plan Discussed with: CRNA ? ?Anesthesia Plan Comments:   ? ? ? ? ? ? ?Anesthesia Quick Evaluation ? ?

## 2021-09-02 NOTE — Progress Notes (Signed)
LABOR PROGRESS NOTE ? ?Subjective: Patient reports presence of pelvic pressure w/o discomfort at this time. Does not report any abnormal s/s. ? ?Objective: ?Blood pressure 109/68, pulse (!) 106, temperature 98.1 ?F (36.7 ?C), temperature source Oral, resp. rate 16, height 5\' 1"  (1.549 m), weight 97.5 kg, last menstrual period 12/01/2020, SpO2 100 %. ?Temp (24hrs), Avg:98 ?F (36.7 ?C), Min:97.2 ?F (36.2 ?C), Max:98.3 ?F (36.8 ?C) ? ? ?Fetal Heart Tones ?Baseline: 145 ?Variability: moderate ?Accelerations: present; 15x15 ?Decelerations: absent ?Overall assessment: cat 1 ? ?Contractions: 1.5-3.5 ? ?Cervical Exam: 7/80%/-1, AROM at this time. Clear, large amount of fluid. Pt tolerated procedure well.   ? ?ASSESSMENT AND PLAN:   ?Normally progressing IUP at [redacted]w[redacted]d in active labor ? ?Continue current management with pitocin titration ?AROM now, recheck 2-3 hours PRN ? ?Electronically Signed By: ?[redacted]w[redacted]d, SNM ?09/02/21 7:30 AM   ?

## 2021-09-02 NOTE — Op Note (Signed)
Julie Atkins ?09/02/2021 ? ?PREOPERATIVE DIAGNOSIS:  Multiparity, undesired fertility ? ?POSTOPERATIVE DIAGNOSIS:  Multiparity, undesired fertility ? ?PROCEDURE:  Postpartum Bilateral Tubal Sterilization using Filshie Clips  ? ?ANESTHESIA:  Epidural ? ?COMPLICATIONS:  None immediate. ? ?ESTIMATED BLOOD LOSS:  Less than 20 ml. ? ?FLUIDS: 700 ml LR. ? ?URINE OUTPUT:  644ml of clear urine. ? ?INDICATIONS: 26 y.o. [id number]  with undesired fertility,status post vaginal delivery, desires permanent sterilization. Risks and benefits of procedure discussed with patient including permanence of method, bleeding, infection, injury to surrounding organs and need for additional procedures. Risk failure of 0.5-1% with increased risk of ectopic gestation if pregnancy occurs was also discussed with patient. ?  ?FINDINGS:  Normal uterus, tubes, and ovaries. Patient had bilateral fallopian tube cysts ? ?TECHNIQUE:  The patient was taken to the operating room where her epidural anesthesia was dosed up to surgical level and found to be adequate.  She was then placed in the dorsal supine position and prepped and draped in sterile fashion.  After an adequate timeout was performed, attention was turned to the patient's abdomen where a small transverse skin incision was made under the umbilical fold. The incision was taken down to the layer of fascia using the scalpel, and fascia was incised, and extended bilaterally using Mayo scissors. The peritoneum was entered in a sharp fashion. Attention was then turned to the patient's uterus, and left fallopian tube was identified and followed out to the fimbriated end.  A Filshie clip was placed on the left fallopian tube about 2 cm from the cornual attachment, with care given to incorporate the underlying mesosalpinx.  A similar process was carried out on the rightl side allowing for bilateral tubal sterilization.  Good hemostasis was noted overall.  Local analgesia was drizzled on both  operative sites.The instruments were then removed from the patient's abdomen and the fascial incision was repaired with 0 Vicryl, and the skin was closed with a 3-0 Monocryl subcuticular stitch. The patient tolerated the procedure well.  Sponge, lap, and needle counts were correct times two.  The patient was then taken to the recovery room awake,  and in stable condition. ? ?

## 2021-09-03 LAB — BIRTH TISSUE RECOVERY COLLECTION (PLACENTA DONATION)

## 2021-09-03 MED ORDER — IBUPROFEN 600 MG PO TABS: 600.0000 mg | ORAL_TABLET | Freq: Four times a day (QID) | ORAL | 0 refills | Status: DC

## 2021-09-03 MED ORDER — DOCUSATE SODIUM 100 MG PO CAPS: 100.0000 mg | ORAL_CAPSULE | Freq: Every day | ORAL | 0 refills | Status: AC | PRN

## 2021-09-03 MED ORDER — ACETAMINOPHEN 325 MG PO TABS: 650.0000 mg | ORAL_TABLET | ORAL | 0 refills | Status: AC | PRN

## 2021-09-03 MED ORDER — OXYCODONE HCL 10 MG PO TABS: 5.0000 mg | ORAL_TABLET | Freq: Four times a day (QID) | ORAL | 0 refills | Status: AC | PRN

## 2021-09-03 NOTE — Lactation Note (Signed)
This note was copied from a baby's chart. ?Lactation Consultation Note ? ?Patient Name: Julie Atkins ?Today's Date: 09/03/2021 ?  ?Age:24 hours ? ?Mom said she had no questions in regards to lactation.  ?Infant is 22 hrs old and has had excellent output (4 voids, 5 stools).  ? ?Consult Status ?Consult Status: Complete ? ? ? ?[name] ?09/03/2021, 12:52 PM ? ? ? ?

## 2021-09-06 ENCOUNTER — Telehealth: Payer: Self-pay

## 2021-09-06 ENCOUNTER — Encounter: Payer: Medicaid Other | Admitting: Obstetrics & Gynecology

## 2021-09-06 NOTE — Telephone Encounter (Signed)
Transition Care Management Follow-up Telephone Call ?Date of discharge and from where: 09/03/2021 from [facility] ?How have you been since you were released from the hospital? Patient stated that she is feeling well and did not have any questions or concerns at this time.  ?Any questions or concerns? No ? ?Items Reviewed: ?Did the pt receive and understand the discharge instructions provided? Yes  ?Medications obtained and verified? Yes  ?Other? No  ?Any new allergies since your discharge? No  ?Dietary orders reviewed? No ?Do you have support at home? Yes  ? ?Functional Questionnaire: (I = Independent and D = Dependent) ?ADLs: I ? ?Bathing/Dressing- I ? ?Meal Prep- I ? ?Eating- I ? ?Maintaining continence- I ? ?Transferring/Ambulation- I ? ?Managing Meds- I ? ? ?Follow up appointments reviewed: ? ?PCP Hospital f/u appt confirmed? No   ?Specialist Hospital f/u appt confirmed? No  Patient will call OBGYN for follow up appt.  ?Are transportation arrangements needed? No  ?If their condition worsens, is the pt aware to call PCP or go to the Emergency Dept.? Yes ?Was the patient provided with contact information for the PCP's office or ED? Yes ?Was to pt encouraged to call back with questions or concerns? Yes ? ?

## 2021-09-14 ENCOUNTER — Telehealth (HOSPITAL_COMMUNITY): Payer: Self-pay | Admitting: *Deleted

## 2021-09-14 NOTE — Telephone Encounter (Signed)
Patient voiced no questions or concerns regarding her health at this time. EPDS=1. Patient voiced no questions or concerns regarding infant at this time. Patient reports infant sleeps in a bassinet on her back. RN reviewed ABCs of safe sleep. Patient verbalized understanding. Patient informed about hospital's virtual postpartum classes and support groups - declined email information at this time. Deforest Hoyles, RN, 09/14/21, 1714 ?

## 2021-10-05 ENCOUNTER — Encounter: Payer: Self-pay | Admitting: *Deleted

## 2021-10-05 ENCOUNTER — Telehealth: Payer: Medicaid Other | Admitting: Women's Health

## 2022-07-03 IMAGING — US US ABDOMEN LIMITED
1 series · 14 of 25 positions shown · non-contrast
Comparison: None.

CLINICAL DATA: Epigastric pain with nausea for 1 day

EXAM:
ULTRASOUND ABDOMEN LIMITED RIGHT UPPER QUADRANT

[Series 1: us abdomen limited ruq (liver/gb) · 14 of 50 slices shown]
[im 1/50]
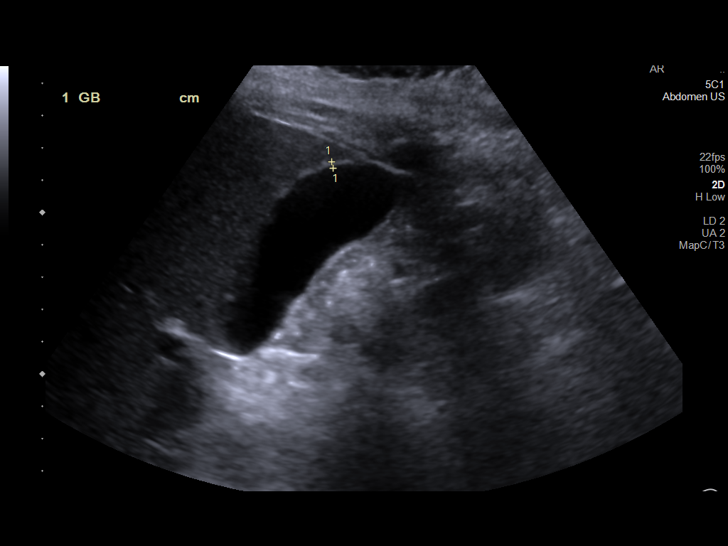
[im 5/50]
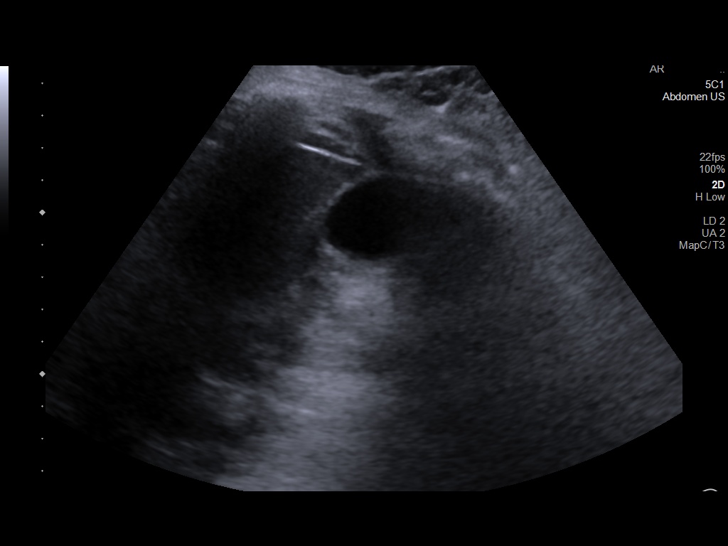
[im 9/50]
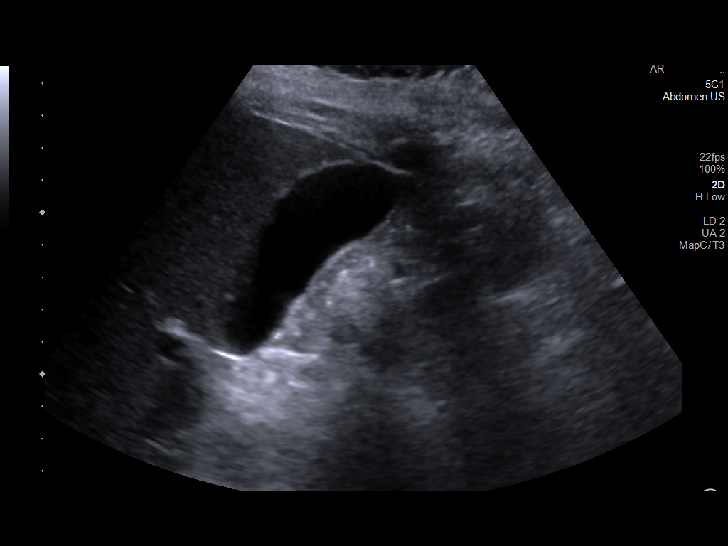
[im 13/50]
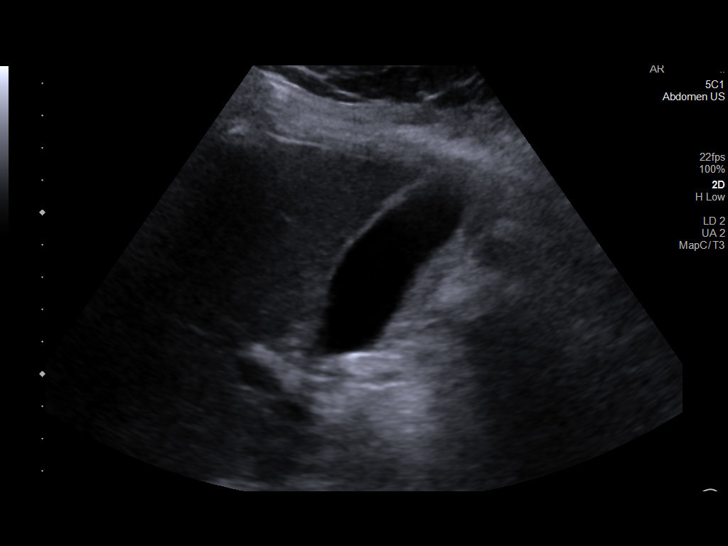
[im 17/50]
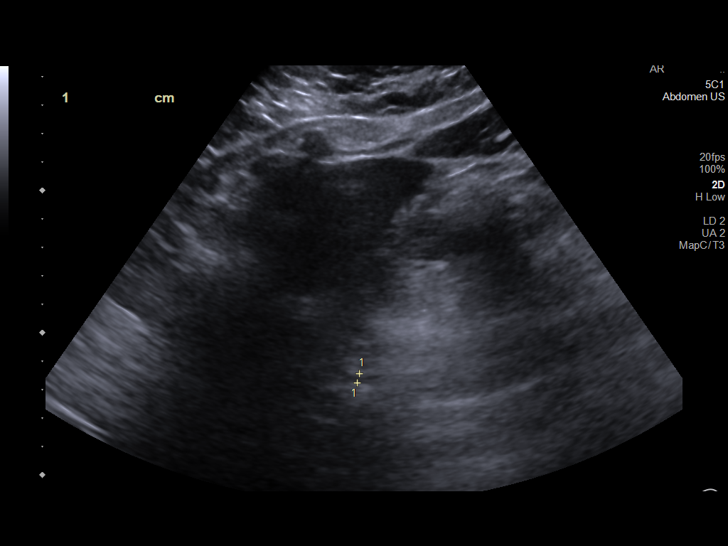
[im 19/50]
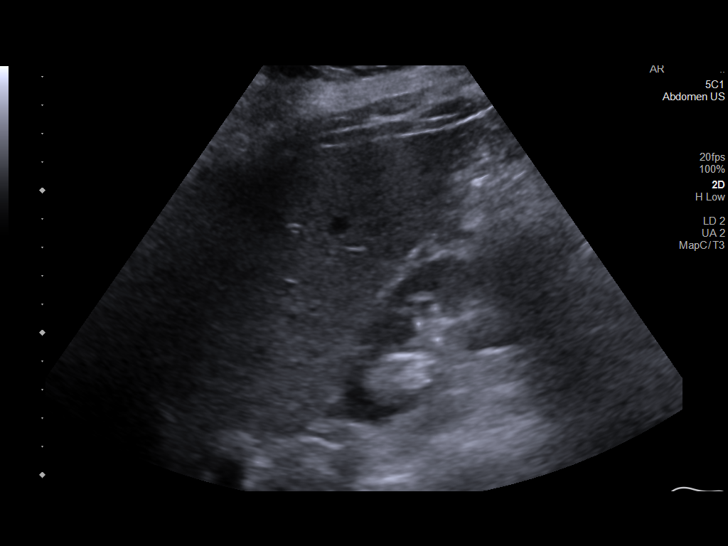
[im 23/50]
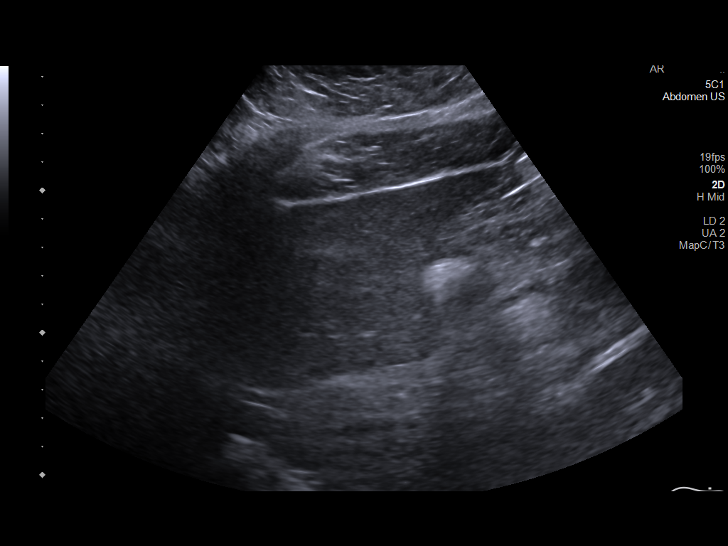
[im 27/50]
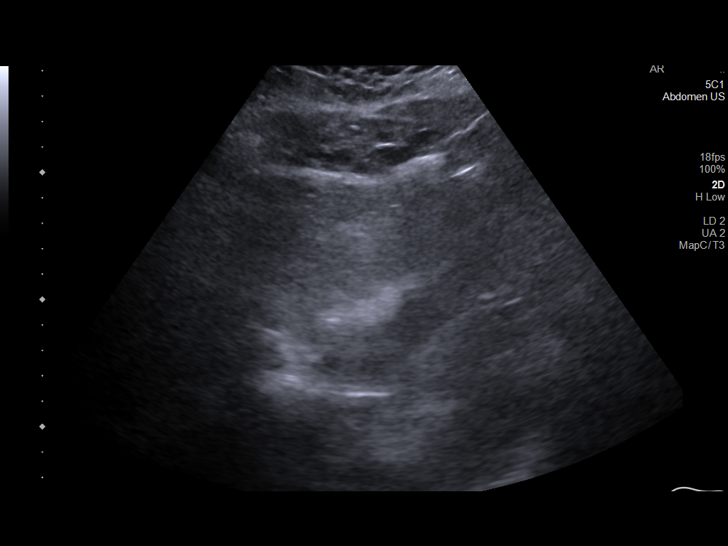
[im 31/50]
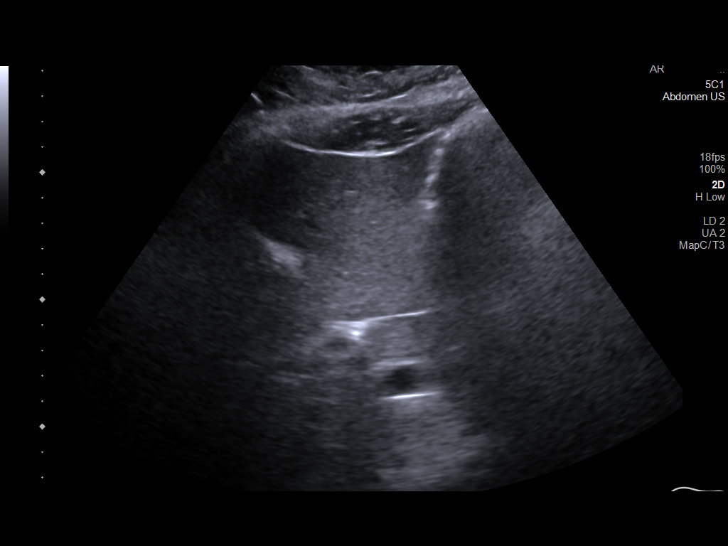
[im 33/50]
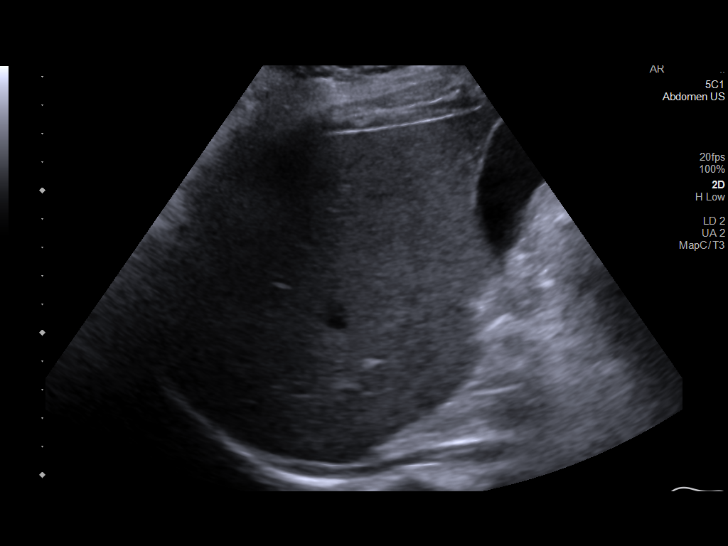
[im 37/50]
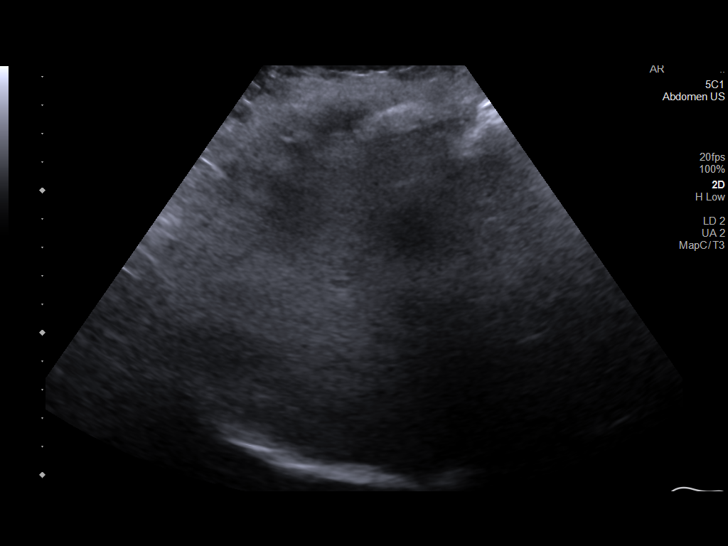
[im 41/50]
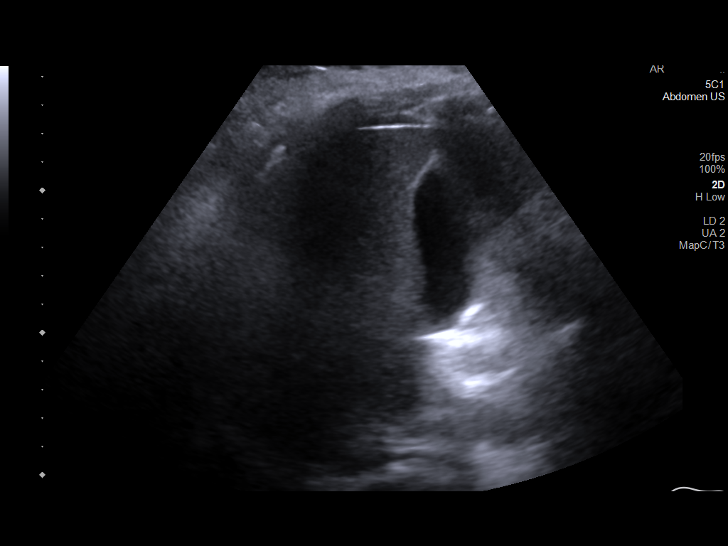
[im 45/50]
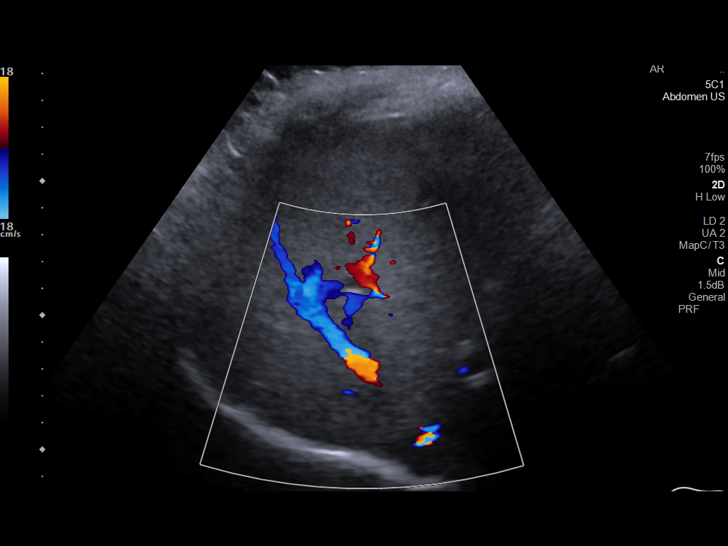
[im 50/50]
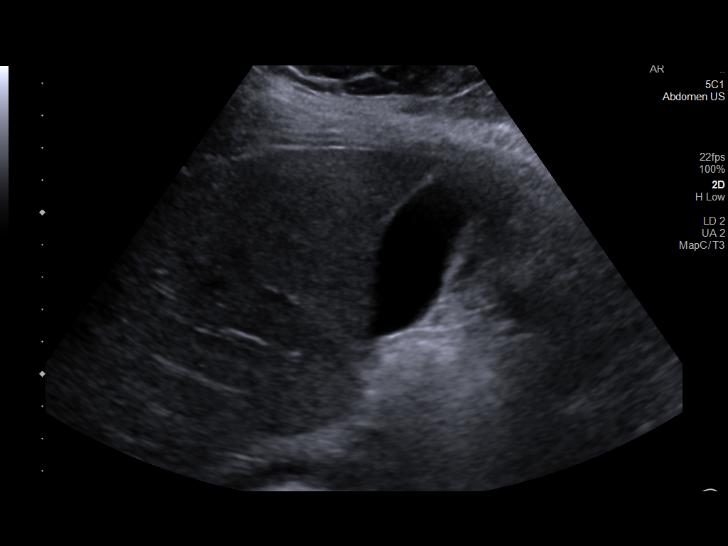

[14 of 25 positions shown; findings below may reference images not displayed]

FINDINGS: Gallbladder:

No gallstones or wall thickening visualized. No sonographic Murphy
sign noted by sonographer.

Common bile duct:

Diameter: 3 mm

Liver:

Mildly increased liver echogenicity, with preservation of the portal
triads, this appearance is likely artifactual/due to technique.
Portal vein is patent on color Doppler imaging with normal direction
of blood flow towards the liver.

Other: None.
IMPRESSION: No evidence of cholecystitis or biliary obstruction.

## 2022-07-03 IMAGING — CT CT ABD-PELV W/ CM
2 of 4 series · 16 of 46 positions shown, 18 images · IV contrast (Omnipaque or Isovue)
Comparison: None.

CLINICAL DATA: Abdominal pain with nausea and diarrhea

EXAM:
CT ABDOMEN AND PELVIS WITH CONTRAST
TECHNIQUE: Multidetector CT imaging of the abdomen and pelvis was performed
using the standard protocol following bolus administration of
intravenous contrast.
CONTRAST:  100mL OMNIPAQUE IOHEXOL 300 MG/ML  SOLN

[Series 2: axial st · axial · 0.80mm/px · z∈[-674,-234]mm · 13 of 96 slices shown, 15 images]
[im 4/96  soft-tissue]
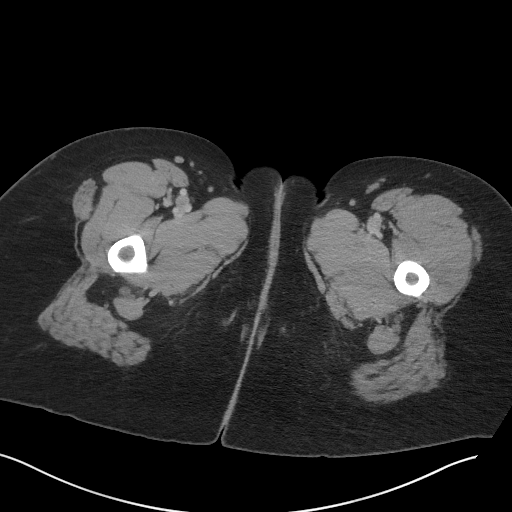
[im 4/96  bone]
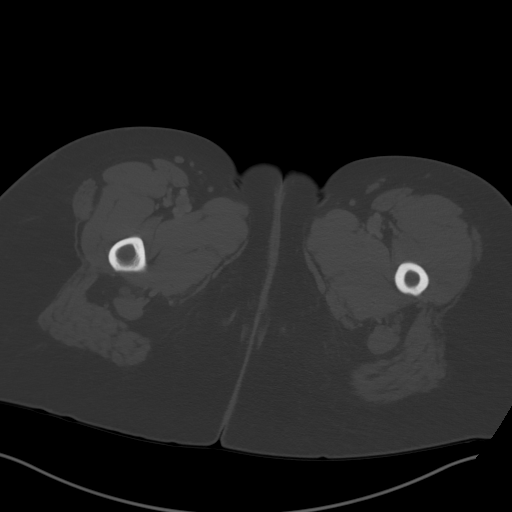
[im 12/96  soft-tissue]
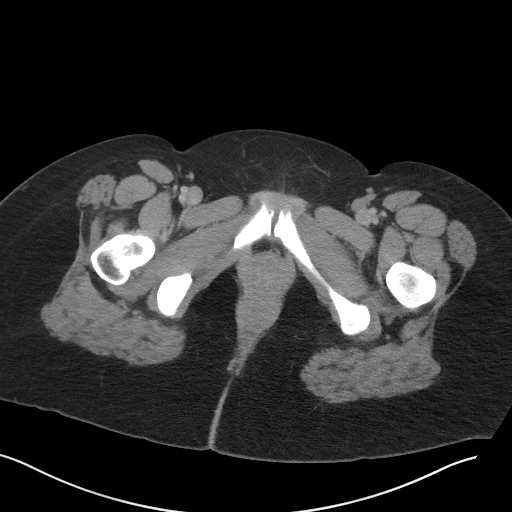
[im 20/96  soft-tissue]
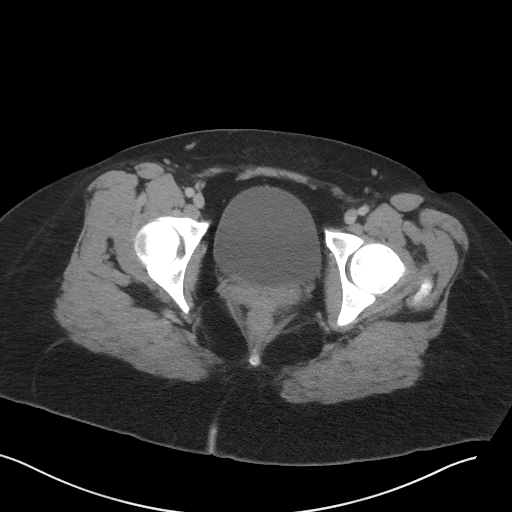
[im 28/96  soft-tissue]
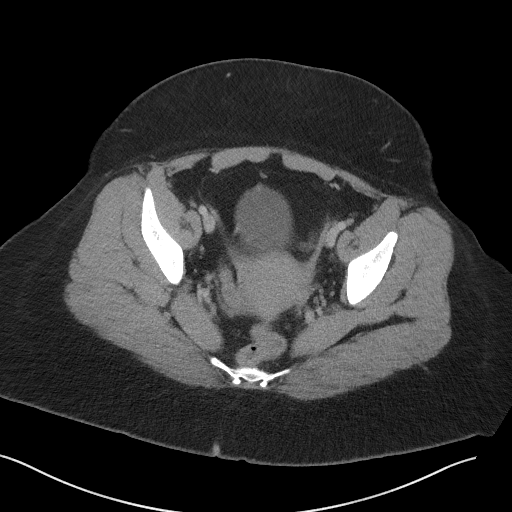
[im 32/96  soft-tissue]
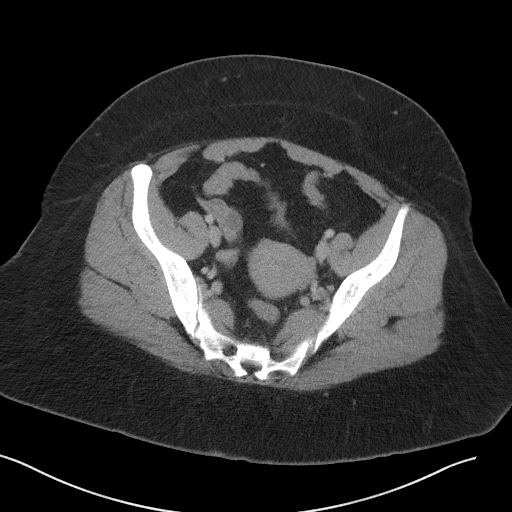
[im 40/96  soft-tissue]
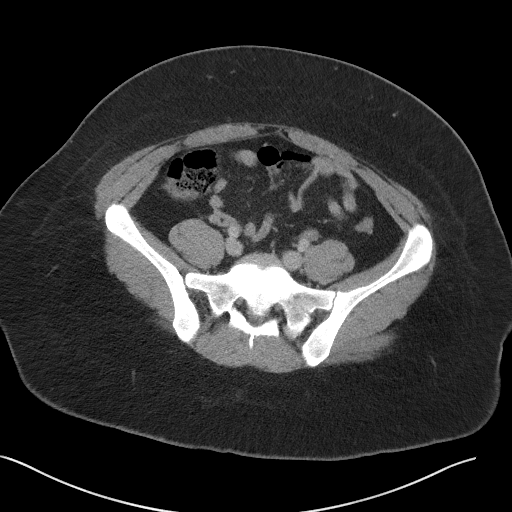
[im 48/96  soft-tissue]
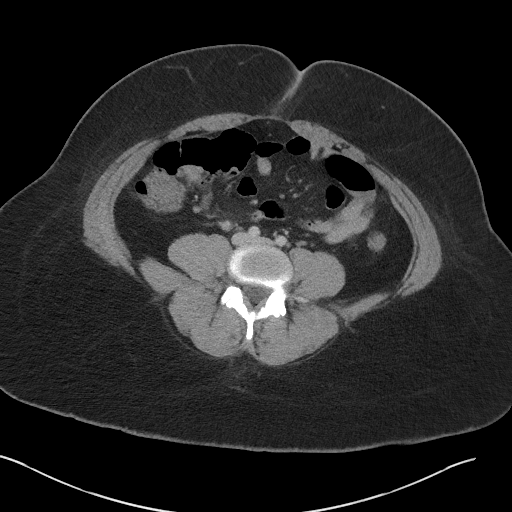
[im 56/96  soft-tissue]
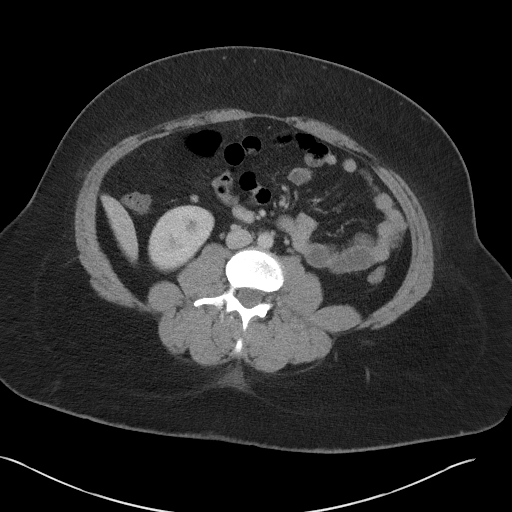
[im 64/96  soft-tissue]
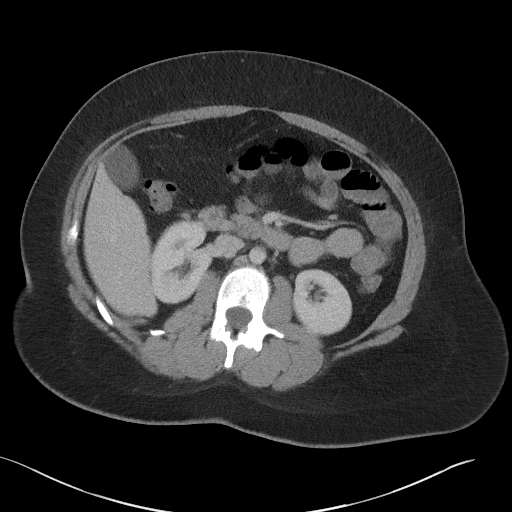
[im 64/96  bone]
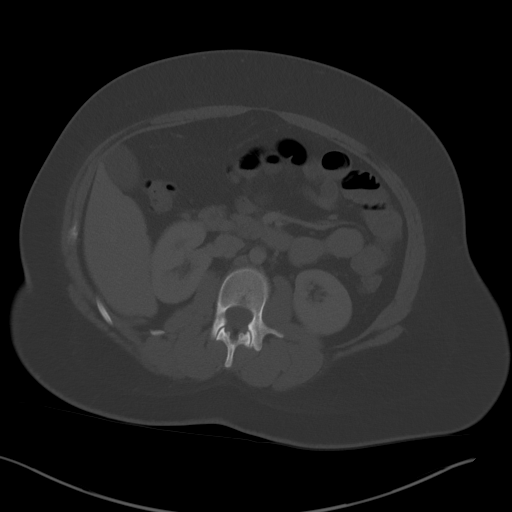
[im 68/96  soft-tissue]
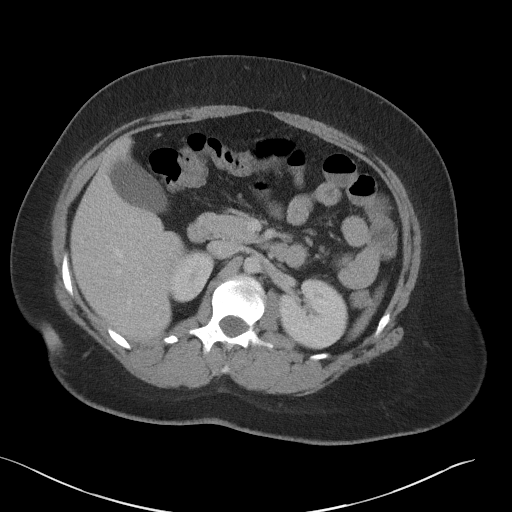
[im 76/96  soft-tissue]
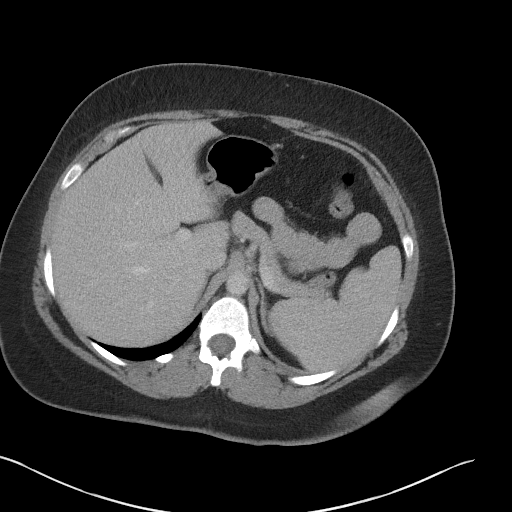
[im 84/96  soft-tissue]
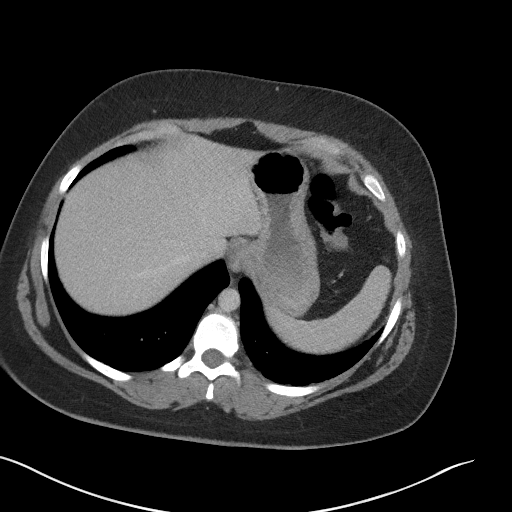
[im 92/96  soft-tissue]
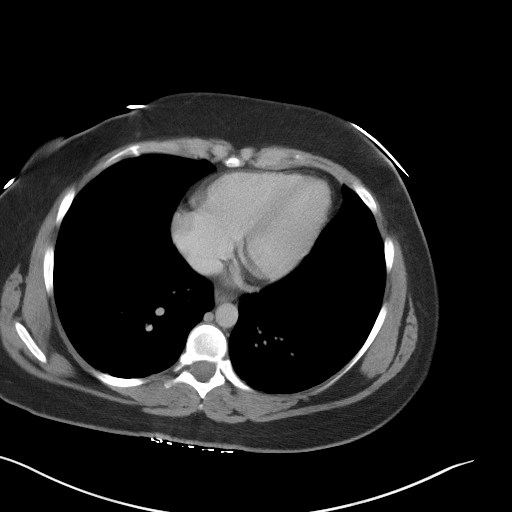

[Series 5: coronal st · coronal · 0.83mm/px · 3 of 117 slices shown]
[im 39/117  soft-tissue]
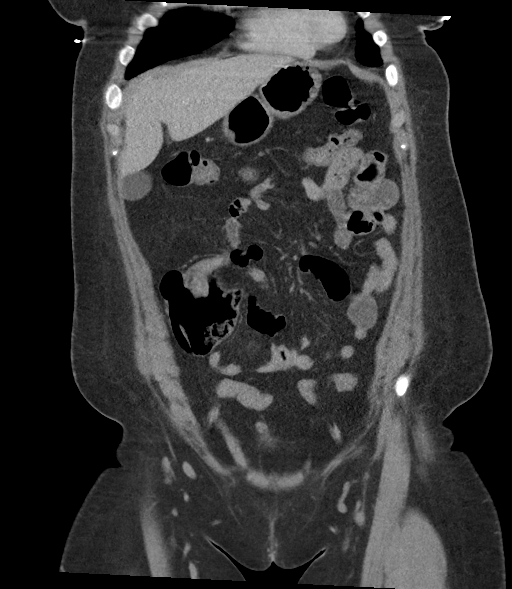
[im 52/117  soft-tissue]
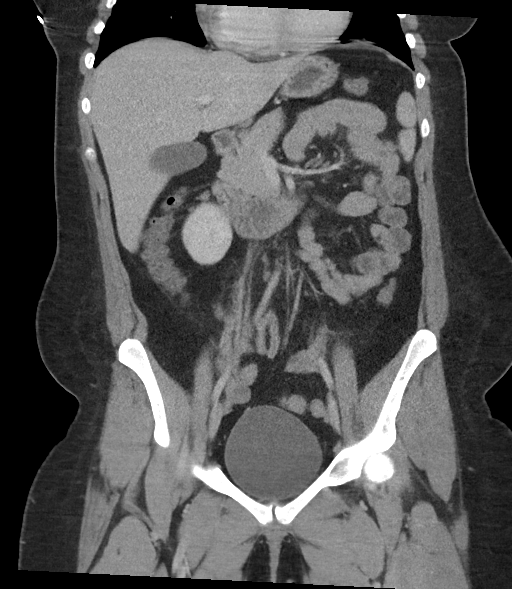
[im 65/117  soft-tissue]
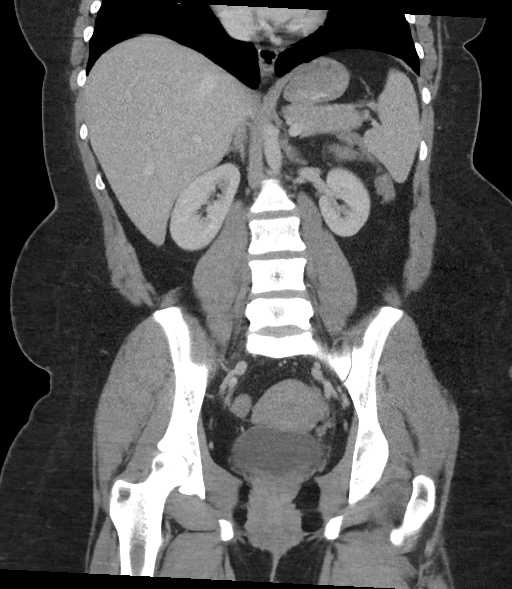

[16 of 46 positions shown; findings below may reference images not displayed]

FINDINGS: LOWER CHEST: Normal.

HEPATOBILIARY: Normal hepatic contours. No intra- or extrahepatic
biliary dilatation. The gallbladder is normal.

PANCREAS: Normal pancreas. No ductal dilatation or peripancreatic
fluid collection.

SPLEEN: Normal.

ADRENALS/URINARY TRACT: The adrenal glands are normal. No
hydronephrosis, nephroureterolithiasis or solid renal mass. The
urinary bladder is normal for degree of distention

STOMACH/BOWEL: There is no hiatal hernia. Normal duodenal course and
caliber. No small bowel dilatation or inflammation. No focal colonic
abnormality. Appendiceal diameter is upper limits of normal. There
is minimal adjacent fat stranding (series 2, image 49 and series 5,
image 50).

VASCULAR/LYMPHATIC: Normal course and caliber of the major abdominal
vessels. No abdominal or pelvic lymphadenopathy.

REPRODUCTIVE: Normal uterus. No adnexal mass.

MUSCULOSKELETAL. No bony spinal canal stenosis or focal osseous
abnormality.

OTHER: None.
IMPRESSION: 1. Upper limits of normal appendix with minimal adjacent fat
stranding. This may indicate early acute appendicitis.
2. No other acute abnormality of the abdomen or pelvis.

## 2022-07-04 IMAGING — US US PELVIS COMPLETE TRANSABD/TRANSVAG W DUPLEX
1 series · 13 of 25 positions shown · non-contrast
Comparison: CT 12/08/2020

CLINICAL DATA: Pelvic pain

EXAM:
TRANSABDOMINAL AND TRANSVAGINAL ULTRASOUND OF PELVIS
DOPPLER ULTRASOUND OF OVARIES
TECHNIQUE: Both transabdominal and transvaginal ultrasound examinations of the
pelvis were performed. Transabdominal technique was performed for
global imaging of the pelvis including uterus, ovaries, adnexal
regions, and pelvic cul-de-sac.
It was necessary to proceed with endovaginal exam following the
transabdominal exam to visualize the uterus endometrium ovaries.
Color and duplex Doppler ultrasound was utilized to evaluate blood
flow to the ovaries.

[Series 1: us pelvic complete w transvaginal and torsion righ · 13 of 75 slices shown]
[im 1/75]
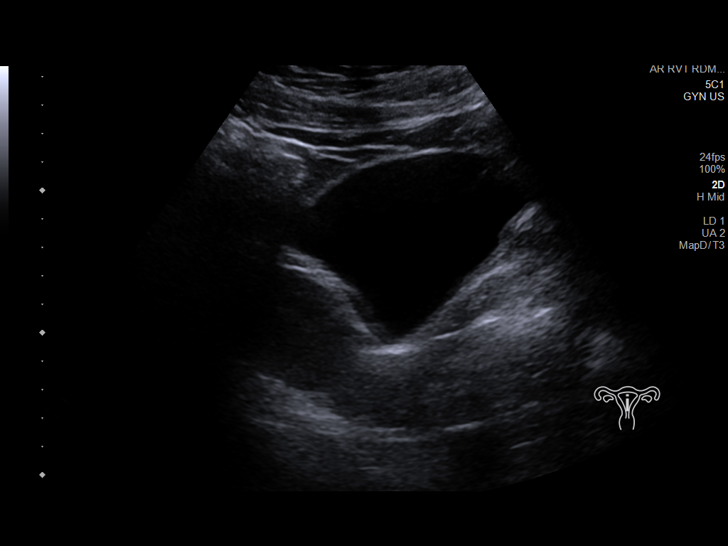
[im 7/75]
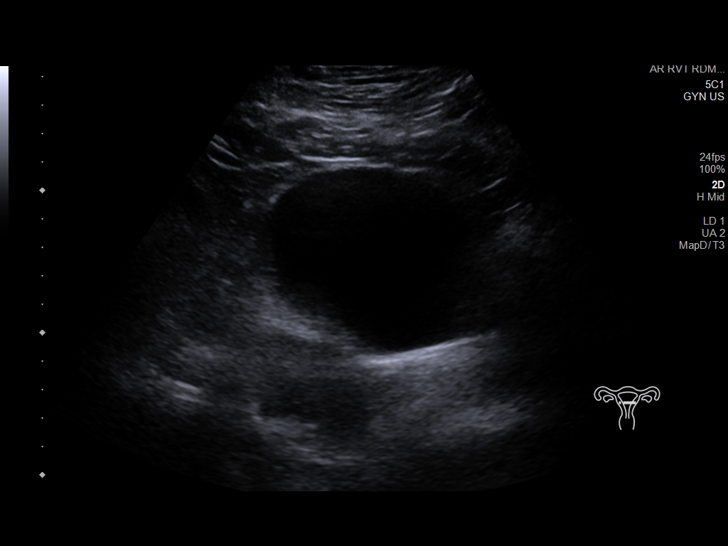
[im 13/75]
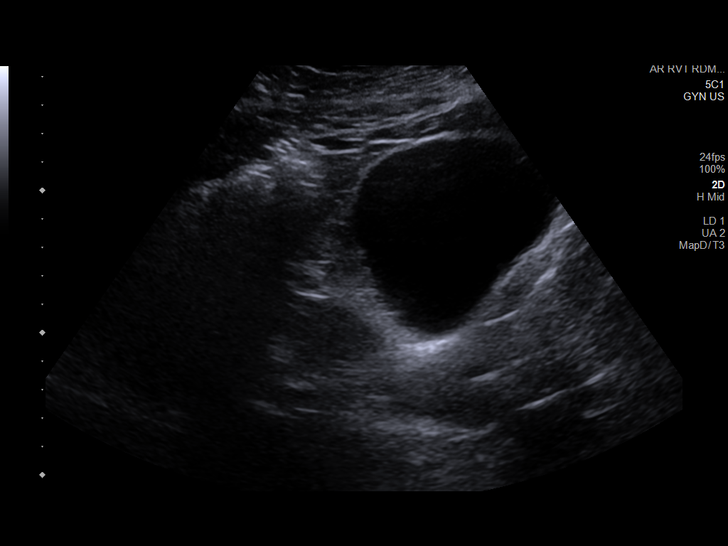
[im 19/75]
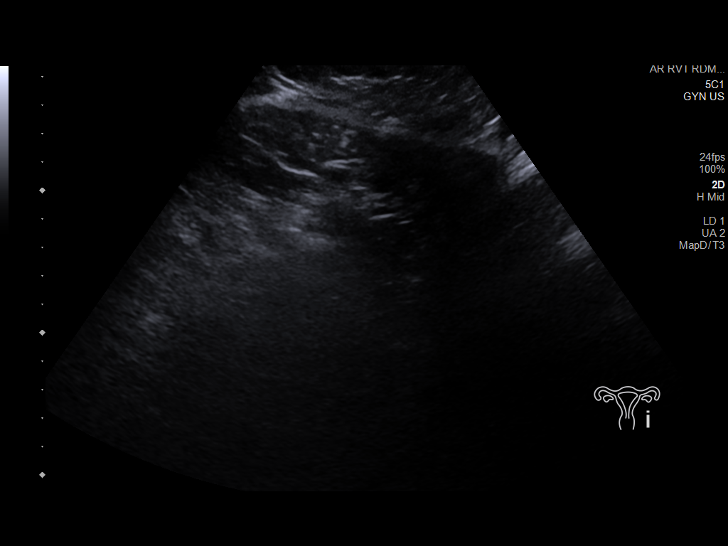
[im 25/75]
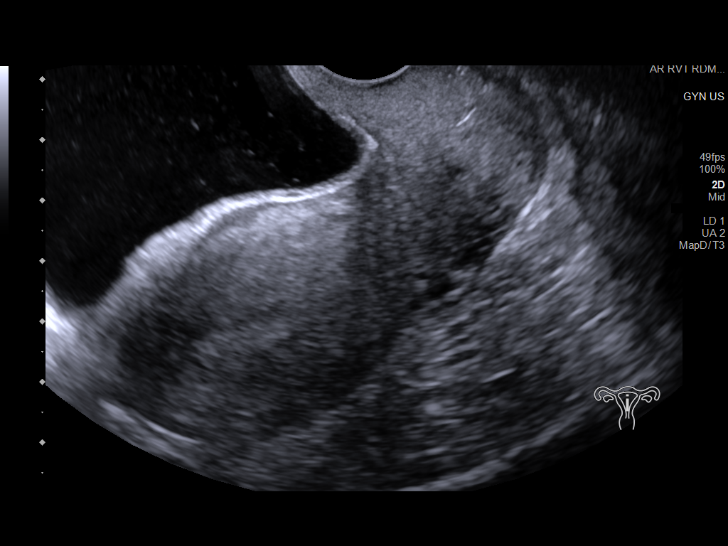
[im 31/75]
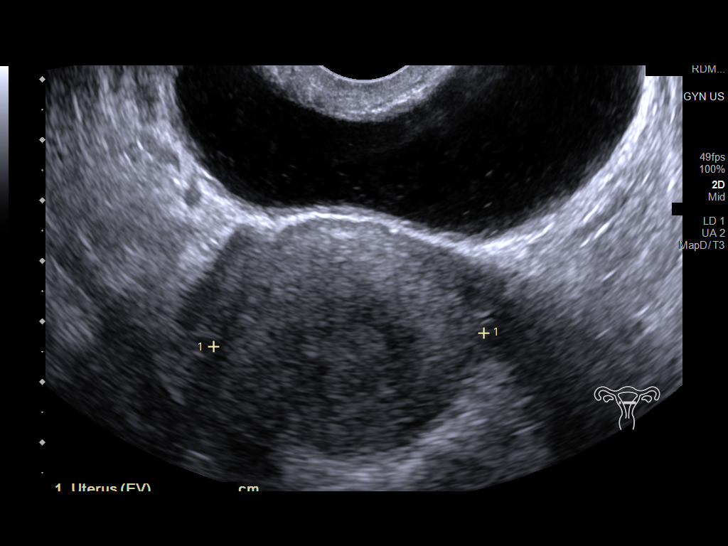
[im 38/75]
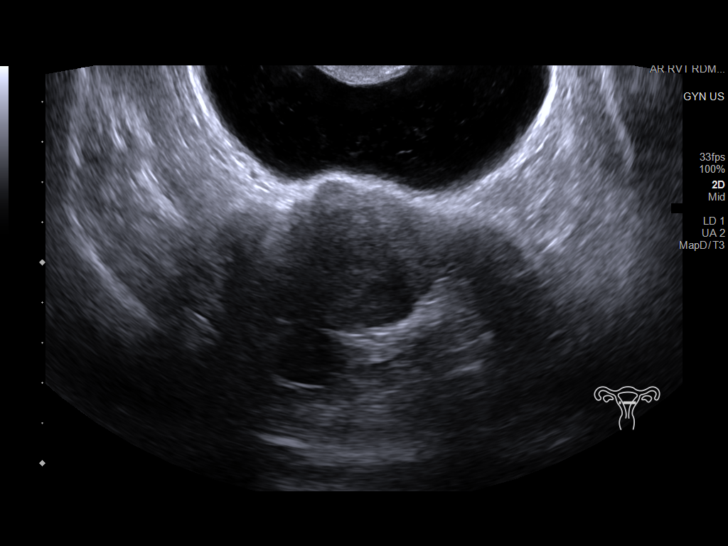
[im 44/75]
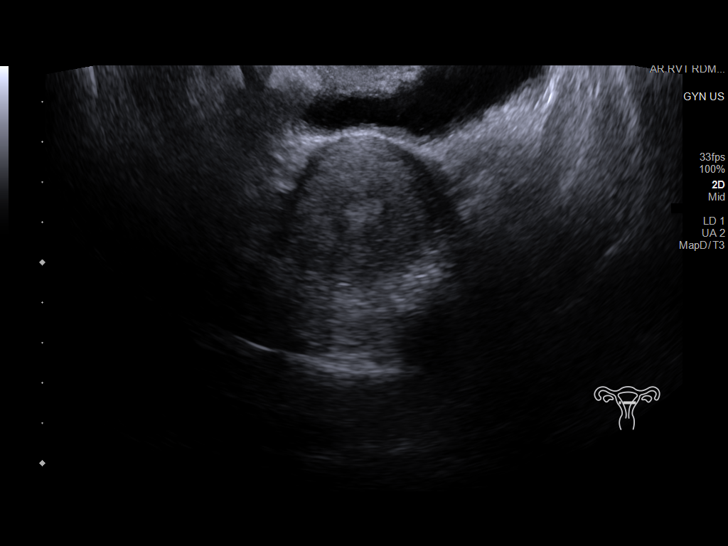
[im 50/75]
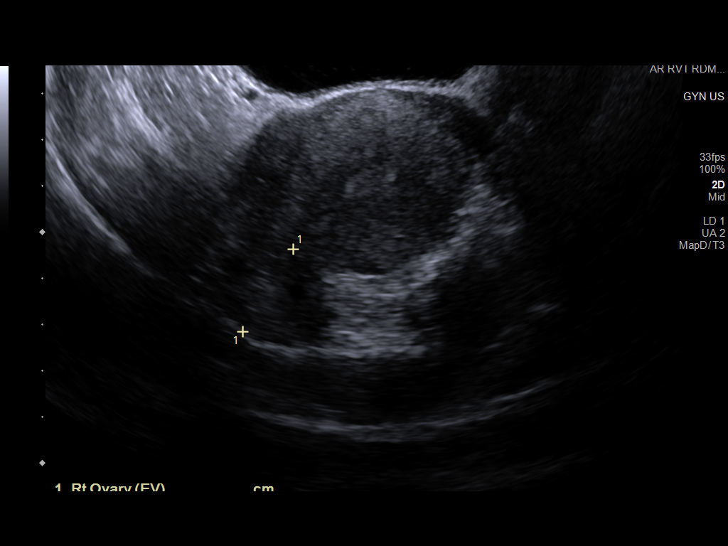
[im 56/75]
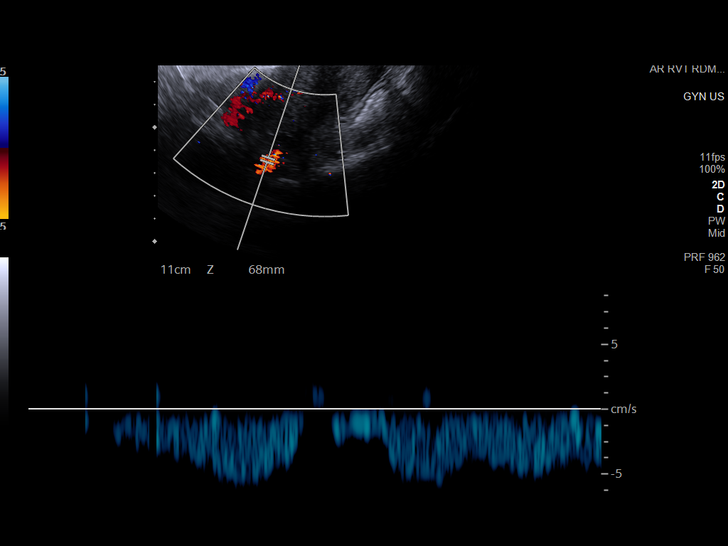
[im 62/75]
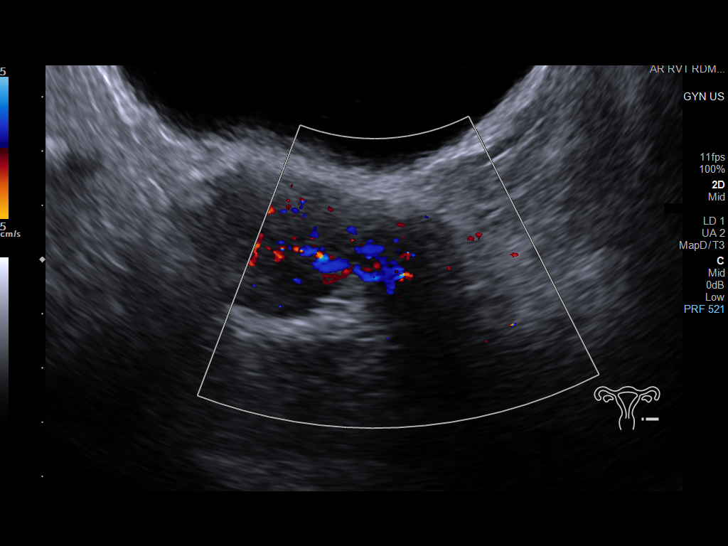
[im 68/75]
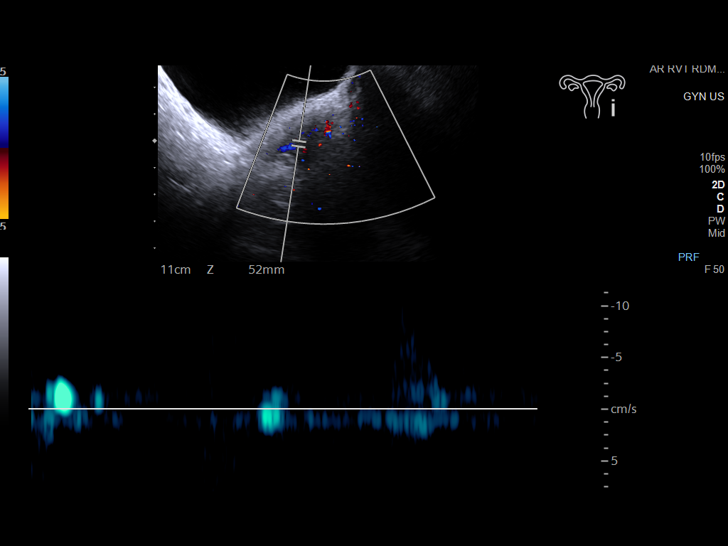
[im 75/75]
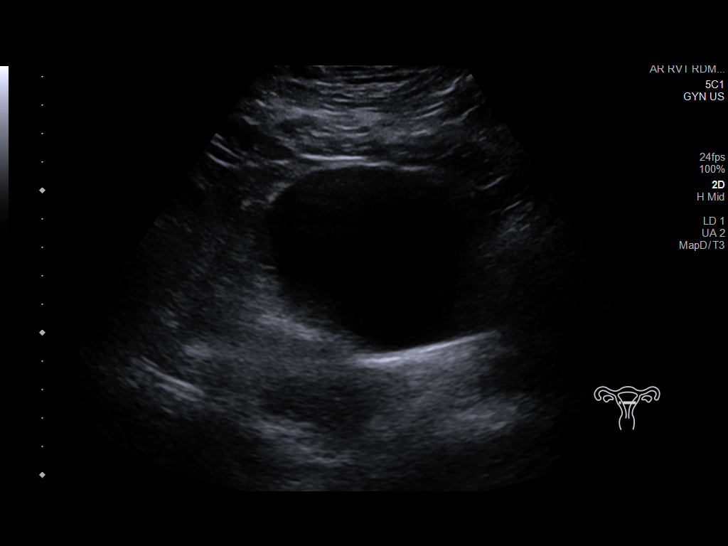

[13 of 25 positions shown; findings below may reference images not displayed]

FINDINGS: Uterus

Measurements: 8.5 x 3.8 x 4.5 cm = volume: 75 mL. No fibroids or
other mass visualized.

Endometrium

Thickness: 5 mm.  No focal abnormality visualized.

Right ovary

Measurements: 3.1 x 2.5 x 2.1 cm = volume: 8.6 mL. Normal
appearance/no adnexal mass.

Left ovary

Measurements: 3.5 x 2 x 1.7 cm = volume: 6.5 mL. Normal
appearance/no adnexal mass.

Pulsed Doppler evaluation of both ovaries demonstrates normal
low-resistance arterial and venous waveforms.

Other findings

Trace free fluid in the pelvis.  Particulate debris in the bladder.
IMPRESSION: 1. Negative for ovarian torsion or ovarian mass lesion.
2. Particulate debris in the urinary bladder

## 2022-08-20 ENCOUNTER — Encounter: Payer: Self-pay | Admitting: Emergency Medicine

## 2022-08-20 ENCOUNTER — Ambulatory Visit
Admission: EM | Admit: 2022-08-20 | Discharge: 2022-08-20 | Disposition: A | Discharge status: Home / Self Care | Payer: Medicaid Other | Attending: Nurse Practitioner | Admitting: Nurse Practitioner

## 2022-08-20 DIAGNOSIS — R21 Rash and other nonspecific skin eruption: Secondary | ICD-10-CM | POA: Diagnosis not present

## 2022-08-20 MED ORDER — TRIAMCINOLONE ACETONIDE 0.1 % EX CREA: 1.0000 | TOPICAL_CREAM | Freq: Two times a day (BID) | CUTANEOUS | 0 refills | Status: AC

## 2022-08-20 MED ORDER — PREDNISONE 20 MG PO TABS: 40.0000 mg | ORAL_TABLET | Freq: Every day | ORAL | 0 refills | Status: AC

## 2022-08-20 NOTE — Discharge Instructions (Addendum)
As discussed, recommend using triamcinolone cream to see if this will help resolve your symptoms.  If rash does not improve, you may start the prednisone that was prescribed.  Please check with the pharmacist to see how this will affect breast-feeding. Avoid hot showers or baths while symptoms persist.  Recommend lukewarm baths or showers to prevent worsening itching and inflammation. Try to avoid rubbing or scratching the rash while it is present. Recommend over-the-counter Aveeno Colloidal Oatmeal bath.  This will help with drying and itching. If symptoms fail to improve, please follow-up in this clinic or with your primary care physician for further evaluation. Follow-up as needed.

## 2022-08-20 NOTE — ED Triage Notes (Signed)
Poison oak on right hand, right side, chest, neck x 2 weeks

## 2022-08-20 NOTE — ED Provider Notes (Signed)
[facility]    CSN: [id number] Arrival date & time: 08/20/22  1230      History   Chief Complaint No chief complaint on file.   HPI Julie Atkins is a 27 y.o. female.   The history is provided by the patient.   Patient presents for complaints of poison oak has been present for the past 2 weeks.  She states the rash is located on the right hand, her right abdomen/flank area, her chest, and on her neck.  She also states that the rash appears to be spreading down her thighs.  She states that she does [place] time in the woods, and that she normally keeps this throughout the summer.  She states she has been using calamine lotion to the rash with minimal relief.  Patient states she is currently breast-feeding.  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   Abdominal pain, RLQ (right lower quadrant) 12/09/2020   History of postpartum hemorrhage 09/19/2018    Past Surgical History:  Procedure Laterality Date   TONSILLECTOMY     TUBAL LIGATION Bilateral 09/02/2021   Procedure: POST PARTUM TUBAL LIGATION;  Surgeon: Caren Macadam, MD;  Location: [facility];  Service: Gynecology;  Laterality: Bilateral;   TYMPANOTOMY  2004    OB History     Gravida  3   Para  3   Term  3   Preterm      AB      Living  3      SAB      IAB      Ectopic      Multiple  0   Live Births  3            Home Medications    Prior to Admission medications   Medication Sig Start Date End Date Taking? Authorizing Provider  predniSONE (DELTASONE) 20 MG tablet Take 2 tablets (40 mg total) by mouth daily with breakfast for 5 days. 08/20/22 08/25/22 Yes Kamryn Messineo-Warren, Alda Lea, NP  triamcinolone cream (KENALOG) 0.1 % Apply 1 Application topically 2 (two) times  daily. 08/20/22  Yes Eero Dini-Warren, Alda Lea, NP  acetaminophen (TYLENOL) 325 MG tablet Take 2 tablets (650 mg total) by mouth every 4 (four) hours as needed (for pain scale < 4). 09/03/21   Renard Matter, MD  docusate sodium (COLACE) 100 MG capsule Take 1 capsule (100 mg total) by mouth daily as needed (constipation). 09/03/21   Renard Matter, MD  ferrous sulfate 325 (65 FE) MG tablet Take 1 tablet (325 mg total) by mouth every other day. 06/23/21   Roma Schanz, CNM  ibuprofen (ADVIL) 600 MG tablet Take 1 tablet (600 mg total) by mouth every 6 (six) hours. 09/03/21   Renard Matter, MD  oxyCODONE 10 MG TABS Take 0.5 tablets (5 mg total) by mouth every 6 (six) hours as needed for severe pain. 09/03/21   Renard Matter, MD  Prenatal Vit-Fe Fumarate-FA (PRENATAL VITAMIN PO) Take by mouth.    [provider]    Family History Family History  Problem Relation Age of Onset   Hypertension Father     Social History Social History   Tobacco Use   Smoking status: Never   Smokeless tobacco: Never  Vaping Use  Vaping Use: Never used  Substance Use Topics   Alcohol use: Not Currently   Drug use: Not Currently     Allergies   Patient has no known allergies.   Review of Systems Review of Systems Per HPI  Physical Exam Triage Vital Signs ED Triage Vitals  Enc Vitals Group     BP 08/20/22 1239 119/75     Pulse Rate 08/20/22 1239 70     Resp 08/20/22 1239 18     Temp 08/20/22 1239 98.2 F (36.8 C)     Temp Source 08/20/22 1239 Oral     SpO2 08/20/22 1239 98 %     Weight --      Height --      Head Circumference --      Peak Flow --      Pain Score 08/20/22 1241 0     Pain Loc --      Pain Edu? --      Excl. in [place]? --    No data found.  Updated Vital Signs BP 119/75 (BP Location: Right Arm)   Pulse 70   Temp 98.2 F (36.8 C) (Oral)   Resp 18   LMP 08/01/2022 (Approximate)   SpO2 98%   Breastfeeding Yes   Visual Acuity Right Eye Distance:   Left Eye Distance:    Bilateral Distance:    Right Eye Near:   Left Eye Near:    Bilateral Near:     Physical Exam Vitals and nursing note reviewed.  Constitutional:      General: She is not in acute distress.    Appearance: Normal appearance.  Eyes:     Extraocular Movements: Extraocular movements intact.     Pupils: Pupils are equal, round, and reactive to light.  Pulmonary:     Effort: Pulmonary effort is normal.  Musculoskeletal:     Cervical back: Normal range of motion.  Skin:    General: Skin is warm and dry.     Findings: Rash present. Rash is macular and papular.     Comments: Maculopapular rash noted to the patient's right hand, chest, right flank/abdomen, and neck.  Rash is present with no current groin pattern.  Rash is both linear and annular in presentation.  Neurological:     General: No focal deficit present.     Mental Status: She is alert and oriented to person, place, and time.  Psychiatric:        Mood and Affect: Mood normal.        Behavior: Behavior normal.      UC Treatments / Results  Labs (all labs ordered are listed, but only abnormal results are displayed) Labs Reviewed - No data to display  EKG   Radiology No results found.  Procedures Procedures (including critical care time)  Medications Ordered in UC Medications - No data to display  Initial Impression / Assessment and Plan / UC Course  I have reviewed the triage vital signs and the nursing notes.  Pertinent labs & imaging results that were available during my care of the patient were reviewed by me and considered in my medical decision making (see chart for details).  The patient is well-appearing, she is in no acute distress, vital signs are stable.  Patient presents with a rash consistent with poison oak/contact dermatitis.  Patient is currently breast-feeding, will start patient on triamcinolone cream 0.1% to see if this will help resolve her symptoms.  Patient was advised if symptoms do not  improve, she can begin the prednisone 40 mg dosage prescribed today.  Patient was advised to double check with the pharmacist to see how this will impact her breast-feeding.  Supportive care recommendations were provided to the patient.  Patient is in agreement with this plan of care and verbalizes understanding.  All questions were answered.  Patient stable for discharge.  Final Clinical Impressions(s) / UC Diagnoses   Final diagnoses:  Rash and nonspecific skin eruption     Discharge Instructions      As discussed, recommend using triamcinolone cream to see if this will help resolve your symptoms.  If rash does not improve, you may start the prednisone that was prescribed.  Please check with the pharmacist to see how this will affect breast-feeding. Avoid hot showers or baths while symptoms persist.  Recommend lukewarm baths or showers to prevent worsening itching and inflammation. Try to avoid rubbing or scratching the rash while it is present. Recommend over-the-counter Aveeno Colloidal Oatmeal bath.  This will help with drying and itching. If symptoms fail to improve, please follow-up in this clinic or with your primary care physician for further evaluation. Follow-up as needed.     ED Prescriptions     Medication Sig Dispense Auth. Provider   triamcinolone cream (KENALOG) 0.1 % Apply 1 Application topically 2 (two) times daily. 80 g Phillippa Straub-Warren, Alda Lea, NP   predniSONE (DELTASONE) 20 MG tablet Take 2 tablets (40 mg total) by mouth daily with breakfast for 5 days. 10 tablet Jahaad Penado-Warren, Alda Lea, NP      PDMP not reviewed this encounter.   Tish Men, NP 08/20/22 1328

## 2023-01-28 ENCOUNTER — Other Ambulatory Visit: Payer: Self-pay

## 2023-01-28 ENCOUNTER — Encounter (HOSPITAL_COMMUNITY): Payer: Self-pay

## 2023-01-28 ENCOUNTER — Emergency Department (HOSPITAL_COMMUNITY)
Admission: EM | Admit: 2023-01-28 | Discharge: 2023-01-28 | Disposition: A | Discharge status: Home / Self Care | Payer: MEDICAID | Attending: Emergency Medicine | Admitting: Emergency Medicine

## 2023-01-28 DIAGNOSIS — Y990 Civilian activity done for income or pay: Secondary | ICD-10-CM | POA: Diagnosis not present

## 2023-01-28 DIAGNOSIS — X509XXA Other and unspecified overexertion or strenuous movements or postures, initial encounter: Secondary | ICD-10-CM | POA: Diagnosis not present

## 2023-01-28 DIAGNOSIS — S76112A Strain of left quadriceps muscle, fascia and tendon, initial encounter: Secondary | ICD-10-CM | POA: Diagnosis not present

## 2023-01-28 DIAGNOSIS — M79652 Pain in left thigh: Secondary | ICD-10-CM | POA: Diagnosis present

## 2023-01-28 MED ORDER — IBUPROFEN 600 MG PO TABS: 600.0000 mg | ORAL_TABLET | Freq: Four times a day (QID) | ORAL | 0 refills | Status: AC | PRN

## 2023-01-28 MED ORDER — LIDOCAINE 5 % EX PTCH
1.0000 | MEDICATED_PATCH | CUTANEOUS | Status: DC
  Administered 2023-01-28: 1 via TRANSDERMAL
  Filled 2023-01-28: qty 1

## 2023-01-28 MED ORDER — KETOROLAC TROMETHAMINE 10 MG PO TABS
10.0000 mg | ORAL_TABLET | Freq: Once | ORAL | Status: AC
  Administered 2023-01-28: 10 mg via ORAL
  Filled 2023-01-28: qty 1

## 2023-01-28 NOTE — Discharge Instructions (Addendum)
It was a pleasure taking care of you today.  You were seen for a left leg injury.  You most they have a muscle strain in your quadriceps.  We gave you crutches and an Ace wrap.  Use ice for 15 minutes at a time for the first several days several times a day.  He can use the Ace wrap to help apply pressure which can relieve some pain as well.  Use the ibuprofen as needed for discomfort and he can use over-the-counter Tylenol.  Come back to the ER if you have new or worsening symptoms otherwise follow-up with your PCP and/or orthopedics

## 2023-01-28 NOTE — ED Triage Notes (Signed)
POV/ pt injured right upper leg while bending over at work/ seen at [facility] where they did X-rays, no fracture/ pt c/o muscle pain/ pt is ambulatory/ A&Ox4

## 2023-01-28 NOTE — ED Provider Notes (Signed)
[facility] Provider Note   CSN: [id number] Arrival date & time: 01/28/23  1832     History  No chief complaint on file.   Julie Atkins is a 27 y.o. female.  Denies any significant past medical history.  Presents to the ER today for left anterior thigh pain.  States she has felt a sharp pull and pain to the left anterior thigh when bending over while working at her job today.  Denies any trauma to the leg.  She has pain when she tries to lift her leg or when she bends the knee.  No pain to the knee joint, no hip or ankle pain.  Starts in the upper thigh and goes to the lower thigh.  No back pain, no numbness or tingling, she is able to ambulate.  She reports she is having trouble walking due to the pain.  She is not able to push off of her left foot when she walks. She went to urgent care and was told it is likely a muscle strain they called and muscle relaxers but she states she is worried that the muscle is torn and decided to come into the ER for further evaluation to see if we could offer anything else.  HPI     Home Medications Prior to Admission medications   Medication Sig Start Date End Date Taking? Authorizing Provider  ibuprofen (ADVIL) 600 MG tablet Take 1 tablet (600 mg total) by mouth every 6 (six) hours as needed. 01/28/23  Yes Ricky Gallery A, PA-C  acetaminophen (TYLENOL) 325 MG tablet Take 2 tablets (650 mg total) by mouth every 4 (four) hours as needed (for pain scale < 4). 09/03/21   Warner Mccreedy, MD  docusate sodium (COLACE) 100 MG capsule Take 1 capsule (100 mg total) by mouth daily as needed (constipation). 09/03/21   Warner Mccreedy, MD  ferrous sulfate 325 (65 FE) MG tablet Take 1 tablet (325 mg total) by mouth every other day. 06/23/21   Cheral Marker, CNM  oxyCODONE 10 MG TABS Take 0.5 tablets (5 mg total) by mouth every 6 (six) hours as needed for severe pain. 09/03/21   Warner Mccreedy, MD  Prenatal Vit-Fe Fumarate-FA  (PRENATAL VITAMIN PO) Take by mouth.    [provider]  triamcinolone cream (KENALOG) 0.1 % Apply 1 Application topically 2 (two) times daily. 08/20/22   Leath-Warren, Sadie Haber, NP      Allergies    Patient has no known allergies.    Review of Systems   Review of Systems  Physical Exam Updated Vital Signs BP 121/71 (BP Location: Right Arm)   Pulse 66   Temp 98.2 F (36.8 C) (Oral)   Resp 17   Wt 89.8 kg   LMP 01/04/2023 (Approximate)   SpO2 100%   BMI 37.41 kg/m  Physical Exam Vitals and nursing note reviewed.  Constitutional:      General: She is not in acute distress.    Appearance: She is well-developed.  HENT:     Head: Normocephalic and atraumatic.     Mouth/Throat:     Mouth: Mucous membranes are moist.  Eyes:     Conjunctiva/sclera: Conjunctivae normal.  Cardiovascular:     Rate and Rhythm: Normal rate and regular rhythm.     Heart sounds: No murmur heard. Pulmonary:     Effort: Pulmonary effort is normal. No respiratory distress.     Breath sounds: Normal breath sounds.  Abdominal:  Palpations: Abdomen is soft.     Tenderness: There is no abdominal tenderness.  Musculoskeletal:        General: No swelling.     Cervical back: Neck supple.     Comments: Noticed to left anterior thigh.  Patient has pain with full extension but is able to maintain extension and hold leg against gravity with knee extended.  She is able to flex to about 50 degrees actively at the knee but has pain beyond that.  DP and PT pulse intact, sensation intact.  No bruising, no swelling.  No tendon defect.  Patella is in normal position no knee swelling noted.  Skin:    General: Skin is warm and dry.     Capillary Refill: Capillary refill takes less than 2 seconds.  Neurological:     General: No focal deficit present.     Mental Status: She is alert and oriented to person, place, and time.  Psychiatric:        Mood and Affect: Mood normal.     ED Results / Procedures /  Treatments   Labs (all labs ordered are listed, but only abnormal results are displayed) Labs Reviewed - No data to display  EKG None  Radiology No results found.  Procedures Procedures    Medications Ordered in ED Medications  lidocaine (LIDODERM) 5 % 1 patch (1 patch Transdermal Patch Applied 01/28/23 1945)  ketorolac (TORADOL) tablet 10 mg (10 mg Oral Given 01/28/23 1945)    ED Course/ Medical Decision Making/ A&P                                 Medical Decision Making DDx: Sprain, strain, contusion, fracture, dislocation, tendon rupture, other ED course: Patient presents for left thigh pain, with already urgent care had x-rays that were normal and shows likely muscle strain.  Prescribed muscle relaxers but states she felt like there is nothing more going on and came directly to the emergency department from urgent care.  She did ambulate having a lot of pain with flexion extension of the left knee and her thigh.  She is able to flex and extend, able to fully extend and hold in extension against gravity.  Patella is normally positioned.  There is no tenderness over the quadriceps or patellar tendon on exam.  Discussed with patient this likely a muscle strain, could be a small muscle tear but she has no signs of tendon rupture.  Will treat conservatively with NSAIDs, RICE therapy, close follow-up.  She is agreeable with plan of care and discharge.           Final Clinical Impression(s) / ED Diagnoses Final diagnoses:  Quadriceps strain, left, initial encounter    Rx / DC Orders ED Discharge Orders          Ordered    ibuprofen (ADVIL) 600 MG tablet  Every 6 hours PRN        01/28/23 1948              Josem Kaufmann 01/28/23 2203    Eber Hong, MD 01/29/23 1357

## 2023-01-28 NOTE — ED Notes (Signed)
Pt left upper thigh has a sharp shooting pain with flexion. Pt does have tenderness to the touch. No swelling or redness noted.  Pt states she was bending down at a table at work and she heard a snap in her muscle not her bone. Pt states that she cannot move her leg without assistance of her hands getting in and out of the car.

## 2023-03-03 ENCOUNTER — Ambulatory Visit
Admission: EM | Admit: 2023-03-03 | Discharge: 2023-03-03 | Disposition: A | Discharge status: Home / Self Care | Payer: MEDICAID | Attending: Family Medicine | Admitting: Family Medicine

## 2023-03-03 DIAGNOSIS — Z113 Encounter for screening for infections with a predominantly sexual mode of transmission: Secondary | ICD-10-CM | POA: Diagnosis present

## 2023-03-03 DIAGNOSIS — N390 Urinary tract infection, site not specified: Secondary | ICD-10-CM | POA: Diagnosis present

## 2023-03-03 LAB — POCT URINE PREGNANCY: Preg Test, Ur: NEGATIVE

## 2023-03-03 LAB — POCT URINALYSIS DIP (MANUAL ENTRY)
Bilirubin, UA: NEGATIVE
Blood, UA: NEGATIVE
Glucose, UA: NEGATIVE mg/dL
Ketones, POC UA: NEGATIVE mg/dL
Nitrite, UA: NEGATIVE
Protein Ur, POC: 100 mg/dL — AB
Spec Grav, UA: 1.02 (ref 1.010–1.025)
Urobilinogen, UA: 1 U/dL
pH, UA: 7 (ref 5.0–8.0)

## 2023-03-03 MED ORDER — CEPHALEXIN 500 MG PO CAPS: 500.0000 mg | ORAL_CAPSULE | Freq: Two times a day (BID) | ORAL | 0 refills | Status: AC

## 2023-03-03 NOTE — ED Triage Notes (Signed)
Pt presents with urinary frequency, burning with urination that started a week ago. Pt would also like to be tested for STDs.

## 2023-03-03 NOTE — ED Provider Notes (Signed)
[facility]    CSN: [id number] Arrival date & time: 03/03/23  1322      History   Chief Complaint Chief Complaint  Patient presents with   Urinary Frequency    HPI Julie Atkins is a 27 y.o. female.   Patient presenting today with about a week of urinary frequency, urgency, dysuria.  Denies fever, chills, hematuria, nausea, vomiting, flank pain.  She is also requesting screening for STIs.  Denies any symptoms of these at this time.  So far not trying anything over-the-counter for symptoms.   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   Abdominal pain, RLQ (right lower quadrant) 12/09/2020   History of postpartum hemorrhage 09/19/2018    Past Surgical History:  Procedure Laterality Date   TONSILLECTOMY     TUBAL LIGATION Bilateral 09/02/2021   Procedure: POST PARTUM TUBAL LIGATION;  Surgeon: Federico Flake, MD;  Location: [facility];  Service: Gynecology;  Laterality: Bilateral;   TYMPANOTOMY  2004    OB History     Gravida  3   Para  3   Term  3   Preterm      AB      Living  3      SAB      IAB      Ectopic      Multiple  0   Live Births  3            Home Medications    Prior to Admission medications   Medication Sig Start Date End Date Taking? Authorizing Provider  cephALEXin (KEFLEX) 500 MG capsule Take 1 capsule (500 mg total) by mouth 2 (two) times daily. 03/03/23  Yes Particia Nearing, PA-C  acetaminophen (TYLENOL) 325 MG tablet Take 2 tablets (650 mg total) by mouth every 4 (four) hours as needed (for pain scale < 4). 09/03/21   Warner Mccreedy, MD  docusate sodium (COLACE) 100 MG capsule Take 1 capsule (100 mg total) by mouth daily as needed (constipation). 09/03/21   Warner Mccreedy, MD  ferrous sulfate 325 (65  FE) MG tablet Take 1 tablet (325 mg total) by mouth every other day. 06/23/21   Cheral Marker, CNM  ibuprofen (ADVIL) 600 MG tablet Take 1 tablet (600 mg total) by mouth every 6 (six) hours as needed. 01/28/23   Carmel Sacramento A, PA-C  oxyCODONE 10 MG TABS Take 0.5 tablets (5 mg total) by mouth every 6 (six) hours as needed for severe pain. 09/03/21   Warner Mccreedy, MD  Prenatal Vit-Fe Fumarate-FA (PRENATAL VITAMIN PO) Take by mouth.    [provider]  triamcinolone cream (KENALOG) 0.1 % Apply 1 Application topically 2 (two) times daily. 08/20/22   Leath-Warren, Sadie Haber, NP    Family History Family History  Problem Relation Age of Onset   Hypertension Father     Social History Social History   Tobacco Use   Smoking status: Never   Smokeless tobacco: Never  Vaping Use   Vaping status: Never Used  Substance Use Topics   Alcohol use: Not Currently   Drug use: Not Currently     Allergies   Patient has no known allergies.   Review of Systems Review of Systems Per HPI  Physical Exam  Triage Vital Signs ED Triage Vitals [03/03/23 1354]  Encounter Vitals Group     BP 129/83     Systolic BP Percentile      Diastolic BP Percentile      Pulse Rate 79     Resp 16     Temp 98.8 F (37.1 C)     Temp Source Oral     SpO2 98 %     Weight      Height      Head Circumference      Peak Flow      Pain Score 0     Pain Loc      Pain Education      Exclude from Growth Chart    No data found.  Updated Vital Signs BP 129/83 (BP Location: Right Arm)   Pulse 79   Temp 98.8 F (37.1 C) (Oral)   Resp 16   LMP 02/03/2023 (Approximate)   SpO2 98%   Breastfeeding Yes   Visual Acuity Right Eye Distance:   Left Eye Distance:   Bilateral Distance:    Right Eye Near:   Left Eye Near:    Bilateral Near:     Physical Exam Vitals and nursing note reviewed.  Constitutional:      Appearance: Normal appearance. She is not ill-appearing.  HENT:     Head:  Atraumatic.  Eyes:     Extraocular Movements: Extraocular movements intact.     Conjunctiva/sclera: Conjunctivae normal.  Cardiovascular:     Rate and Rhythm: Normal rate and regular rhythm.     Heart sounds: Normal heart sounds.  Pulmonary:     Effort: Pulmonary effort is normal.     Breath sounds: Normal breath sounds.  Abdominal:     General: Bowel sounds are normal. There is no distension.     Palpations: Abdomen is soft.     Tenderness: There is no abdominal tenderness. There is no right CVA tenderness, left CVA tenderness or guarding.  Genitourinary:    Comments: GU exam deferred, self swab performed Musculoskeletal:        General: Normal range of motion.     Cervical back: Normal range of motion and neck supple.  Skin:    General: Skin is warm and dry.  Neurological:     Mental Status: She is alert and oriented to person, place, and time.  Psychiatric:        Mood and Affect: Mood normal.        Thought Content: Thought content normal.        Judgment: Judgment normal.    UC Treatments / Results  Labs (all labs ordered are listed, but only abnormal results are displayed) Labs Reviewed  POCT URINALYSIS DIP (MANUAL ENTRY) - Abnormal; Notable for the following components:      Result Value   Protein Ur, POC =100 (*)    Leukocytes, UA Moderate (2+) (*)    All other components within normal limits  URINE CULTURE  HIV ANTIBODY (ROUTINE TESTING W REFLEX)  RPR  POCT URINE PREGNANCY  CERVICOVAGINAL ANCILLARY ONLY    EKG   Radiology No results found.  Procedures Procedures (including critical care time)  Medications Ordered in UC Medications - No data to display  Initial Impression / Assessment and Plan / UC Course  I have reviewed the triage vital signs and the nursing notes.  Pertinent labs & imaging results that were available during my care of the patient were reviewed by me and  considered in my medical decision making (see chart for details).      Vitals and exam reassuring today, urinalysis with evidence of a possible UTI.  Urine culture pending, treat with Keflex in the meantime and adjust if needed.  STI screening pending, treat based on results.  Final Clinical Impressions(s) / UC Diagnoses   Final diagnoses:  Acute lower UTI  Screening examination for STI   Discharge Instructions   None    ED Prescriptions     Medication Sig Dispense Auth. Provider   cephALEXin (KEFLEX) 500 MG capsule Take 1 capsule (500 mg total) by mouth 2 (two) times daily. 10 capsule Particia Nearing, [place]      PDMP not reviewed this encounter.   Particia Nearing, [place] 03/03/23 1506

## 2023-03-04 LAB — RPR: RPR Ser Ql: NONREACTIVE

## 2023-03-04 LAB — HIV ANTIBODY (ROUTINE TESTING W REFLEX): HIV Screen 4th Generation wRfx: NONREACTIVE

## 2023-03-05 LAB — URINE CULTURE: Culture: <10000 — AB

## 2023-03-06 ENCOUNTER — Telehealth: Payer: Self-pay | Admitting: Emergency Medicine

## 2023-03-06 LAB — CERVICOVAGINAL ANCILLARY ONLY
Bacterial Vaginitis (gardnerella): NEGATIVE
Candida Glabrata: NEGATIVE
Candida Vaginitis: POSITIVE — AB
Chlamydia: NEGATIVE
Comment: NEGATIVE
Comment: NEGATIVE
Comment: NEGATIVE
Comment: NEGATIVE
Comment: NEGATIVE
Comment: NORMAL
Neisseria Gonorrhea: NEGATIVE
Trichomonas: NEGATIVE

## 2023-03-06 MED ORDER — FLUCONAZOLE 150 MG PO TABS: 150.0000 mg | ORAL_TABLET | Freq: Once | ORAL | 0 refills | Status: AC

## 2023-03-06 NOTE — Telephone Encounter (Signed)
Diflucan for positive yeast Patient to d/c Keflex
